# Patient Record
Sex: Female | Born: 1970 | Race: White | Hispanic: No | Marital: Married | Smoking: Current every day smoker
Health system: Southern US, Community
[De-identification: ages and names within clinical notes are randomized; demographics above are authoritative.]

## PROBLEM LIST (undated history)

## (undated) DIAGNOSIS — D72829 Elevated white blood cell count, unspecified: Secondary | ICD-10-CM

## (undated) DIAGNOSIS — F419 Anxiety disorder, unspecified: Secondary | ICD-10-CM

## (undated) DIAGNOSIS — G43909 Migraine, unspecified, not intractable, without status migrainosus: Secondary | ICD-10-CM

## (undated) HISTORY — DX: Anxiety disorder, unspecified: F41.9

---

## 2001-07-17 ENCOUNTER — Ambulatory Visit (HOSPITAL_COMMUNITY): Admission: RE | Admit: 2001-07-17 | Discharge: 2001-07-17 | Payer: Self-pay | Admitting: Obstetrics & Gynecology

## 2011-06-08 ENCOUNTER — Emergency Department (HOSPITAL_COMMUNITY)
Admission: EM | Admit: 2011-06-08 | Discharge: 2011-06-08 | Disposition: A | Discharge status: Home / Self Care | Payer: Private Health Insurance - Indemnity | Attending: Emergency Medicine | Admitting: Emergency Medicine

## 2011-06-08 ENCOUNTER — Encounter: Payer: Self-pay | Admitting: *Deleted

## 2011-06-08 DIAGNOSIS — J029 Acute pharyngitis, unspecified: Secondary | ICD-10-CM | POA: Insufficient documentation

## 2011-06-08 DIAGNOSIS — R490 Dysphonia: Secondary | ICD-10-CM | POA: Insufficient documentation

## 2011-06-08 DIAGNOSIS — J309 Allergic rhinitis, unspecified: Secondary | ICD-10-CM

## 2011-06-08 DIAGNOSIS — J329 Chronic sinusitis, unspecified: Secondary | ICD-10-CM | POA: Insufficient documentation

## 2011-06-08 MED ORDER — HYDROCODONE-ACETAMINOPHEN 5-325 MG PO TABS: 1.0000 | ORAL_TABLET | ORAL | Status: AC | PRN

## 2011-06-08 MED ORDER — AMOXICILLIN 500 MG PO CAPS: 500.0000 mg | ORAL_CAPSULE | Freq: Three times a day (TID) | ORAL | Status: AC

## 2011-06-08 MED ORDER — FEXOFENADINE-PSEUDOEPHED ER 60-120 MG PO TB12: 1.0000 | ORAL_TABLET | Freq: Two times a day (BID) | ORAL | Status: AC

## 2011-06-08 MED ORDER — LIDOCAINE VISCOUS 2 % MT SOLN
20.0000 mL | Freq: Once | OROMUCOSAL | Status: DC
  Filled 2011-06-08: qty 20

## 2011-06-08 NOTE — ED Notes (Signed)
Pt c/o sore throat, right earache, nasal congestion x 3 months. Also c/o  blisters in nose and throat since Sunday.
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
## 2011-06-08 NOTE — ED Notes (Signed)
Pt refused Lidocaine viscous solution.

## 2011-06-08 NOTE — ED Notes (Signed)
Pt stable. NAD at this time. D/C instructions and Rx given. Pt verbalized understanding. Pt ambulated with steady gate to d/c lobby.

## 2011-06-09 NOTE — ED Provider Notes (Signed)
Medical screening examination/treatment/procedure(s) were performed by non-physician practitioner and as supervising physician I was immediately available for consultation/collaboration.   Annette Liotta R. Carmen Vallecillo, MD 06/09/11 1310 

## 2011-08-11 ENCOUNTER — Emergency Department (HOSPITAL_COMMUNITY)
Admission: EM | Admit: 2011-08-11 | Discharge: 2011-08-11 | Disposition: A | Discharge status: Home / Self Care | Payer: Self-pay | Attending: Emergency Medicine | Admitting: Emergency Medicine

## 2011-08-11 ENCOUNTER — Encounter (HOSPITAL_COMMUNITY): Payer: Self-pay | Admitting: Emergency Medicine

## 2011-08-11 DIAGNOSIS — G43909 Migraine, unspecified, not intractable, without status migrainosus: Secondary | ICD-10-CM | POA: Insufficient documentation

## 2011-08-11 DIAGNOSIS — R51 Headache: Secondary | ICD-10-CM

## 2011-08-11 DIAGNOSIS — F17200 Nicotine dependence, unspecified, uncomplicated: Secondary | ICD-10-CM | POA: Insufficient documentation

## 2011-08-11 HISTORY — DX: Migraine, unspecified, not intractable, without status migrainosus: G43.909

## 2011-08-11 MED ORDER — KETOROLAC TROMETHAMINE 60 MG/2ML IM SOLN
60.0000 mg | Freq: Once | INTRAMUSCULAR | Status: AC
  Administered 2011-08-11: 60 mg via INTRAMUSCULAR
  Filled 2011-08-11: qty 2

## 2011-08-11 MED ORDER — PROMETHAZINE HCL 25 MG/ML IJ SOLN
25.0000 mg | Freq: Once | INTRAMUSCULAR | Status: AC
  Administered 2011-08-11: 25 mg via INTRAMUSCULAR
  Filled 2011-08-11: qty 1

## 2011-08-11 MED ORDER — DIPHENHYDRAMINE HCL 25 MG PO CAPS
25.0000 mg | ORAL_CAPSULE | Freq: Once | ORAL | Status: AC
  Administered 2011-08-11: 25 mg via ORAL
  Filled 2011-08-11: qty 1

## 2011-08-11 NOTE — ED Notes (Signed)
Patient c/o migraine since last night. Patient reports sensitivity to light and sound with nausea and blurred vision. Denies any vomiting. Per patient hx of migraines with same symptoms.

## 2011-08-11 NOTE — ED Notes (Signed)
Reports taking something (a pill) before she came and that the headache pain is already beginning to ease off.  Medications given as ordered.  Tolerated well.

## 2011-08-11 NOTE — ED Notes (Addendum)
C/o "migraine headache"--History for same and reports this episodes to be similar to previous ones--rates headache pain a 10 on 1-10 scale.--nausea, photophobic

## 2011-08-11 NOTE — ED Provider Notes (Signed)
History     CSN: [id number] Arrival date & time: 08/11/2011 12:28 PM   First MD Initiated Contact with Patient 08/11/11 1306      Chief Complaint  Patient presents with  . Migraine    (Consider location/radiation/quality/duration/timing/severity/associated sxs/prior treatment) Patient is a 40 y.o. female presenting with migraine. The history is provided by the patient.  Migraine The current episode started yesterday. The problem occurs constantly. The problem has not changed since onset.Associated symptoms include headaches. Pertinent negatives include no chest pain, no abdominal pain and no shortness of breath.   patient has a history of migraine headaches for which she has previously been worked up. She developed one of her typical headaches yesterday. His right-sided headache but does involve the left side somewhat to. It is throbbing. She's had some nauseousness. She has some photophobia. No vomiting. No head injury. No numbness or weakness. No relief with her medicines at home. She states she just wants a shot in the button to go home. She states that she is hungry. She states that she could not be pregnant. She states [employer] works, she cannot afford.  Past Medical History  Diagnosis Date  . Migraines     History reviewed. No pertinent past surgical history.  Family History  Problem Relation Age of Onset  . Hypertension Mother   . Heart failure Mother   . Hyperlipidemia Mother   . Hypertension Father   . Heart failure Father   . Hyperlipidemia Father   . Cancer Sister     History  Substance Use Topics  . Smoking status: Current Everyday Smoker -- 1.0 packs/day for 15 years    Types: Cigarettes  . Smokeless tobacco: Never Used  . Alcohol Use: No    OB History    Grav Para Term Preterm Abortions TAB SAB Ect Mult Living   2 2        2       Review of Systems  Constitutional: Negative for activity change and appetite change.  HENT: Negative for neck stiffness.     Eyes: Positive for photophobia. Negative for pain and visual disturbance.  Respiratory: Negative for chest tightness and shortness of breath.   Cardiovascular: Negative for chest pain and leg swelling.  Gastrointestinal: Positive for nausea. Negative for abdominal pain and diarrhea.  Genitourinary: Negative for flank pain.  Musculoskeletal: Negative for back pain.  Skin: Negative for rash.  Neurological: Positive for headaches. Negative for weakness and numbness.  Psychiatric/Behavioral: Negative for behavioral problems.    Allergies  Clindamycin/lincomycin  Home Medications   Current Outpatient Rx  Name Route Sig Dispense Refill  . FEXOFENADINE-PSEUDOEPHEDRINE 60-120 MG PO TB12 Oral Take 1 tablet by mouth every 12 (twelve) hours. 30 tablet 0    BP 140/86  Pulse 108  Temp(Src) 97.9 F (36.6 C) (Oral)  Resp 18  Ht 5\' 7"  (1.702 m)  Wt 135 lb (61.236 kg)  BMI 21.14 kg/m2  SpO2 100%  LMP 08/08/2011  Physical Exam  Nursing note and vitals reviewed. Constitutional: She is oriented to person, place, and time. She appears well-developed and well-nourished.       Uncomfortable appearing  HENT:  Head: Normocephalic and atraumatic.  Eyes: Pupils are equal, round, and reactive to light.  Neck: Normal range of motion.  Cardiovascular: Normal rate, regular rhythm and normal heart sounds.   No murmur heard. Pulmonary/Chest: Effort normal and breath sounds normal. No respiratory distress. She has no wheezes. She has no rales.  Abdominal: Soft. Bowel sounds  are normal. She exhibits no distension. There is no tenderness. There is no rebound and no guarding.  Musculoskeletal: Normal range of motion.  Neurological: She is alert and oriented to person, place, and time. No cranial nerve deficit.  Skin: Skin is warm and dry.  Psychiatric: She has a normal mood and affect. Her speech is normal.       Patient is tearful    ED Course  Procedures (including critical care time)  Labs  Reviewed - No data to display No results found.   1. Headache       MDM  Patient has a headache typical of her migraines. She has previously been worked up. No new symptoms or injury. She states she just wants a shot and to go home she will be given Phenergan Toradol and Benadryl to be discharged.        Juliet Rude. Rubin Payor, MD 08/11/11 1323

## 2011-10-10 ENCOUNTER — Encounter (HOSPITAL_COMMUNITY): Payer: Self-pay | Admitting: Emergency Medicine

## 2011-10-10 ENCOUNTER — Emergency Department (HOSPITAL_COMMUNITY)
Admission: EM | Admit: 2011-10-10 | Discharge: 2011-10-10 | Disposition: A | Discharge status: Home / Self Care | Payer: Self-pay | Attending: Emergency Medicine | Admitting: Emergency Medicine

## 2011-10-10 DIAGNOSIS — R059 Cough, unspecified: Secondary | ICD-10-CM | POA: Insufficient documentation

## 2011-10-10 DIAGNOSIS — R0602 Shortness of breath: Secondary | ICD-10-CM | POA: Insufficient documentation

## 2011-10-10 DIAGNOSIS — J069 Acute upper respiratory infection, unspecified: Secondary | ICD-10-CM | POA: Insufficient documentation

## 2011-10-10 DIAGNOSIS — J40 Bronchitis, not specified as acute or chronic: Secondary | ICD-10-CM | POA: Insufficient documentation

## 2011-10-10 DIAGNOSIS — R05 Cough: Secondary | ICD-10-CM | POA: Insufficient documentation

## 2011-10-10 DIAGNOSIS — R509 Fever, unspecified: Secondary | ICD-10-CM | POA: Insufficient documentation

## 2011-10-10 MED ORDER — PREDNISONE 10 MG PO TABS: ORAL_TABLET | ORAL | Status: DC

## 2011-10-10 MED ORDER — DOXYCYCLINE HYCLATE 100 MG PO TABS
100.0000 mg | ORAL_TABLET | Freq: Once | ORAL | Status: DC
  Filled 2011-10-10: qty 1

## 2011-10-10 MED ORDER — ALBUTEROL SULFATE HFA 108 (90 BASE) MCG/ACT IN AERS: 2.0000 | INHALATION_SPRAY | RESPIRATORY_TRACT | Status: DC

## 2011-10-10 MED ORDER — DOXYCYCLINE HYCLATE 100 MG PO CAPS: 100.0000 mg | ORAL_CAPSULE | Freq: Two times a day (BID) | ORAL | Status: AC

## 2011-10-10 NOTE — ED Notes (Signed)
Patient c/o fevers, cough, and shortness of breath since Sunday. Per patient recently took Augmentin for sinus infection. Per patient cough productive, sputum dark brown-not observed.

## 2011-10-10 NOTE — ED Provider Notes (Signed)
History     CSN: [id number]  Arrival date & time 10/10/11  1114   First MD Initiated Contact with Patient 10/10/11 1155      Chief Complaint  Patient presents with  . Fever  . Cough  . Shortness of Breath    (Consider location/radiation/quality/duration/timing/severity/associated sxs/prior treatment) Patient is a 40 y.o. female presenting with fever, cough, and shortness of breath. The history is provided by the patient.  Fever Primary symptoms of the febrile illness include fever, cough and shortness of breath. Primary symptoms do not include wheezing, abdominal pain, dysuria or arthralgias. The current episode started 3 to 5 days ago. This is a new problem. The problem has been gradually worsening.  Associated with: nothing. Risk factors: none.Primary symptoms comment: headache  Cough Associated symptoms include shortness of breath. Pertinent negatives include no chest pain and no wheezing.  Shortness of Breath  Associated symptoms include a fever, cough and shortness of breath. Pertinent negatives include no chest pain and no wheezing.    Past Medical History  Diagnosis Date  . Migraines     History reviewed. No pertinent past surgical history.  Family History  Problem Relation Age of Onset  . Hypertension Mother   . Heart failure Mother   . Hyperlipidemia Mother   . Hypertension Father   . Heart failure Father   . Hyperlipidemia Father   . Cancer Sister     History  Substance Use Topics  . Smoking status: Current Everyday Smoker -- 1.0 packs/day for 15 years    Types: Cigarettes  . Smokeless tobacco: Never Used  . Alcohol Use: No    OB History    Grav Para Term Preterm Abortions TAB SAB Ect Mult Living   2 2 2       2       Review of Systems  Constitutional: Positive for fever. Negative for activity change.       All ROS Neg except as noted in HPI  HENT: Negative for nosebleeds and neck pain.   Eyes: Negative for photophobia and discharge.    Respiratory: Positive for cough and shortness of breath. Negative for wheezing.   Cardiovascular: Negative for chest pain and palpitations.  Gastrointestinal: Negative for abdominal pain and blood in stool.  Genitourinary: Negative for dysuria, frequency and hematuria.  Musculoskeletal: Negative for back pain and arthralgias.  Skin: Negative.   Neurological: Negative for dizziness, seizures and speech difficulty.  Psychiatric/Behavioral: Negative for hallucinations and confusion.    Allergies  Clindamycin/lincomycin  Home Medications   Current Outpatient Rx  Name Route Sig Dispense Refill  . FEXOFENADINE-PSEUDOEPHED ER 60-120 MG PO TB12 Oral Take 1 tablet by mouth every 12 (twelve) hours. 30 tablet 0  . OXYCODONE-ACETAMINOPHEN 7.5-325 MG PO TABS Oral Take 1 tablet by mouth every 4 (four) hours as needed. For migraines     . SUMATRIPTAN SUCCINATE 50 MG PO TABS Oral Take 50 mg by mouth every 2 (two) hours as needed. For migraines       BP 116/73  Pulse 102  Temp(Src) 99.6 F (37.6 C) (Oral)  Resp 18  Ht 5\' 7"  (1.702 m)  Wt 140 lb (63.504 kg)  BMI 21.93 kg/m2  SpO2 99%  LMP 09/27/2011  Physical Exam  Nursing note and vitals reviewed. Constitutional: She is oriented to person, place, and time. She appears well-developed and well-nourished.  Non-toxic appearance.  HENT:  Head: Normocephalic.  Right Ear: Tympanic membrane and external ear normal.  Left Ear: Tympanic membrane and  external ear normal.  Eyes: EOM and lids are normal. Pupils are equal, round, and reactive to light.  Neck: Normal range of motion. Neck supple. Carotid bruit is not present.  Cardiovascular: Regular rhythm, normal heart sounds, intact distal pulses and normal pulses.  Tachycardia present.  Exam reveals no friction rub.   Pulmonary/Chest: Effort normal. No respiratory distress. She has wheezes. She has rhonchi.  Abdominal: Soft. Bowel sounds are normal. There is no tenderness. There is no guarding.   Musculoskeletal: Normal range of motion.  Lymphadenopathy:       Head (right side): No submandibular adenopathy present.       Head (left side): No submandibular adenopathy present.    She has no cervical adenopathy.  Neurological: She is alert and oriented to person, place, and time. She has normal strength. No cranial nerve deficit or sensory deficit.  Skin: Skin is warm and dry.  Psychiatric: She has a normal mood and affect. Her speech is normal.    ED Course  Procedures (including critical care time)  Labs Reviewed - No data to display No results found.   No diagnosis found.    MDM  I have reviewed nursing notes, vital signs, and all appropriate lab and imaging results for this patient.        Kathie Dike, PA 10/10/11 1733  Kathie Dike, [place] 10/10/11 [phone]

## 2011-10-10 NOTE — ED Notes (Signed)
Pt left before receiving  Inhaler.

## 2011-10-11 NOTE — ED Provider Notes (Signed)
Medical screening examination/treatment/procedure(s) were performed by non-physician practitioner and as supervising physician I was immediately available for consultation/collaboration.   Laray Anger, DO 10/11/11 [phone]

## 2012-01-24 ENCOUNTER — Encounter (HOSPITAL_COMMUNITY): Payer: Self-pay | Admitting: Emergency Medicine

## 2012-01-24 ENCOUNTER — Emergency Department (HOSPITAL_COMMUNITY)
Admission: EM | Admit: 2012-01-24 | Discharge: 2012-01-24 | Disposition: A | Discharge status: Home / Self Care | Payer: BC Managed Care – PPO | Attending: Emergency Medicine | Admitting: Emergency Medicine

## 2012-01-24 DIAGNOSIS — R51 Headache: Secondary | ICD-10-CM | POA: Insufficient documentation

## 2012-01-24 MED ORDER — METOCLOPRAMIDE HCL 5 MG/ML IJ SOLN
20.0000 mg | Freq: Once | INTRAVENOUS | Status: AC
  Administered 2012-01-24: 20 mg via INTRAVENOUS
  Filled 2012-01-24 (×2): qty 4

## 2012-01-24 MED ORDER — SUMATRIPTAN SUCCINATE 6 MG/0.5ML ~~LOC~~ SOLN
6.0000 mg | Freq: Once | SUBCUTANEOUS | Status: AC
  Administered 2012-01-24: 6 mg via SUBCUTANEOUS
  Filled 2012-01-24: qty 0.5

## 2012-01-24 MED ORDER — DIPHENHYDRAMINE HCL 50 MG/ML IJ SOLN
25.0000 mg | Freq: Once | INTRAMUSCULAR | Status: AC
  Administered 2012-01-24: 25 mg via INTRAVENOUS
  Filled 2012-01-24: qty 1

## 2012-01-24 MED ORDER — SODIUM CHLORIDE 0.9 % IV BOLUS (SEPSIS)
1000.0000 mL | Freq: Once | INTRAVENOUS | Status: AC
  Administered 2012-01-24: 1000 mL via INTRAVENOUS

## 2012-01-24 MED ORDER — KETOROLAC TROMETHAMINE 30 MG/ML IJ SOLN
30.0000 mg | Freq: Once | INTRAMUSCULAR | Status: AC
  Administered 2012-01-24: 30 mg via INTRAVENOUS
  Filled 2012-01-24: qty 1

## 2012-01-24 NOTE — Discharge Instructions (Signed)
Follow up with your md if problems °

## 2012-01-24 NOTE — ED Notes (Signed)
Patient c/o right sided headache since 0200; states she feels sick.

## 2012-01-24 NOTE — ED Notes (Signed)
Discharge instructions reviewed with pt, questions answered. Pt verbalized understanding.  

## 2012-01-24 NOTE — ED Notes (Signed)
Pt reports no relief from headache, "these medicines are not working, please tell the doctor". Will make MD aware.

## 2012-01-24 NOTE — ED Provider Notes (Signed)
History     CSN: [id number]  Arrival date & time 01/24/12  0619   First MD Initiated Contact with Patient 01/24/12 [phone]      Chief Complaint  Patient presents with  . Headache    (Consider location/radiation/quality/duration/timing/severity/associated sxs/prior treatment) Patient is a 41 y.o. female presenting with headaches. The history is provided by the patient (pt states she started with a headache today). No language interpreter was used.  Headache  This is a recurrent problem. The current episode started 1 to 2 hours ago. The problem occurs constantly. The problem has not changed since onset.The headache is associated with nothing. The pain is located in the left unilateral region. The quality of the pain is described as sharp. The pain is at a severity of 5/10. The pain is moderate. Pertinent negatives include no anorexia and no fever. She has tried nothing for the symptoms. The treatment provided no relief.    Past Medical History  Diagnosis Date  . Migraines     History reviewed. No pertinent past surgical history.  Family History  Problem Relation Age of Onset  . Hypertension Mother   . Heart failure Mother   . Hyperlipidemia Mother   . Hypertension Father   . Heart failure Father   . Hyperlipidemia Father   . Cancer Sister     History  Substance Use Topics  . Smoking status: Current Everyday Smoker -- 1.0 packs/day for 15 years    Types: Cigarettes  . Smokeless tobacco: Never Used  . Alcohol Use: No    OB History    Grav Para Term Preterm Abortions TAB SAB Ect Mult Living   2 2 2       2       Review of Systems  Constitutional: Negative for fever and fatigue.  HENT: Negative for congestion, sinus pressure and ear discharge.   Eyes: Negative for discharge.  Respiratory: Negative for cough.   Cardiovascular: Negative for chest pain.  Gastrointestinal: Negative for abdominal pain, diarrhea and anorexia.  Genitourinary: Negative for frequency and  hematuria.  Musculoskeletal: Negative for back pain.  Skin: Negative for rash.  Neurological: Positive for headaches. Negative for seizures.  Hematological: Negative.   Psychiatric/Behavioral: Negative for hallucinations.    Allergies  Clindamycin/lincomycin  Home Medications   Current Outpatient Rx  Name Route Sig Dispense Refill  . FEXOFENADINE-PSEUDOEPHED ER 60-120 MG PO TB12 Oral Take 1 tablet by mouth every 12 (twelve) hours. 30 tablet 0  . OXYCODONE-ACETAMINOPHEN 7.5-325 MG PO TABS Oral Take 1 tablet by mouth every 4 (four) hours as needed. For migraines     . SUMATRIPTAN SUCCINATE 50 MG PO TABS Oral Take 50 mg by mouth every 2 (two) hours as needed. For migraines     . PREDNISONE 10 MG PO TABS  6,5,4,3,2,1 - take with food 21 tablet 0    BP 146/111  Pulse 88  Temp(Src) 97.8 F (36.6 C) (Oral)  Resp 20  Ht 5\' 7"  (1.702 m)  Wt 160 lb (72.576 kg)  BMI 25.06 kg/m2  SpO2 98%  LMP 01/20/2012  Physical Exam  Constitutional: She is oriented to person, place, and time. She appears well-developed.  HENT:  Head: Normocephalic and atraumatic.  Eyes: Conjunctivae and EOM are normal. No scleral icterus.  Neck: Neck supple. No thyromegaly present.  Cardiovascular: Normal rate and regular rhythm.  Exam reveals no gallop and no friction rub.   No murmur heard. Pulmonary/Chest: No stridor. She has no wheezes. She has no rales.  She exhibits no tenderness.  Abdominal: She exhibits no distension. There is no tenderness. There is no rebound.  Musculoskeletal: Normal range of motion. She exhibits no edema.  Lymphadenopathy:    She has no cervical adenopathy.  Neurological: She is oriented to person, place, and time. Coordination normal.  Skin: No rash noted. No erythema.  Psychiatric: She has a normal mood and affect. Her behavior is normal.    ED Course  Procedures (including critical care time)   Labs Reviewed  URINALYSIS, ROUTINE W REFLEX MICROSCOPIC   No results  found.   1. Headache    Pt improved with imitrex   MDM  migraine        Benny Lennert, MD 01/24/12 [phone]

## 2012-08-21 ENCOUNTER — Other Ambulatory Visit (HOSPITAL_COMMUNITY): Payer: Self-pay | Admitting: Internal Medicine

## 2012-08-21 DIAGNOSIS — Z139 Encounter for screening, unspecified: Secondary | ICD-10-CM

## 2012-08-21 DIAGNOSIS — Z0000 Encounter for general adult medical examination without abnormal findings: Secondary | ICD-10-CM

## 2012-08-24 ENCOUNTER — Inpatient Hospital Stay (HOSPITAL_COMMUNITY): Admission: RE | Admit: 2012-08-24 | Payer: BC Managed Care – PPO | Source: Ambulatory Visit

## 2013-02-11 ENCOUNTER — Encounter (HOSPITAL_COMMUNITY): Payer: Self-pay | Admitting: *Deleted

## 2013-02-11 ENCOUNTER — Emergency Department (HOSPITAL_COMMUNITY): Payer: BC Managed Care – PPO

## 2013-02-11 ENCOUNTER — Emergency Department (HOSPITAL_COMMUNITY)
Admission: EM | Admit: 2013-02-11 | Discharge: 2013-02-11 | Disposition: A | Discharge status: Home / Self Care | Payer: BC Managed Care – PPO | Attending: Emergency Medicine | Admitting: Emergency Medicine

## 2013-02-11 DIAGNOSIS — D72829 Elevated white blood cell count, unspecified: Secondary | ICD-10-CM | POA: Insufficient documentation

## 2013-02-11 DIAGNOSIS — R1110 Vomiting, unspecified: Secondary | ICD-10-CM | POA: Insufficient documentation

## 2013-02-11 DIAGNOSIS — F17200 Nicotine dependence, unspecified, uncomplicated: Secondary | ICD-10-CM | POA: Insufficient documentation

## 2013-02-11 DIAGNOSIS — Z3202 Encounter for pregnancy test, result negative: Secondary | ICD-10-CM | POA: Insufficient documentation

## 2013-02-11 DIAGNOSIS — N201 Calculus of ureter: Secondary | ICD-10-CM | POA: Insufficient documentation

## 2013-02-11 DIAGNOSIS — Z79899 Other long term (current) drug therapy: Secondary | ICD-10-CM | POA: Insufficient documentation

## 2013-02-11 DIAGNOSIS — G43909 Migraine, unspecified, not intractable, without status migrainosus: Secondary | ICD-10-CM | POA: Insufficient documentation

## 2013-02-11 LAB — CBC WITH DIFFERENTIAL/PLATELET
Basophils Absolute: 0 10*3/uL (ref 0.0–0.1)
Basophils Relative: 0 % (ref 0–1)
Eosinophils Absolute: 0.2 10*3/uL (ref 0.0–0.7)
HCT: 41.8 % (ref 36.0–46.0)
Hemoglobin: 14.1 g/dL (ref 12.0–15.0)
Lymphocytes Relative: 15 % (ref 12–46)
MCHC: 33.7 g/dL (ref 30.0–36.0)
Monocytes Relative: 6 % (ref 3–12)
Neutro Abs: 16.1 10*3/uL — ABNORMAL HIGH (ref 1.7–7.7)
Neutrophils Relative %: 78 % — ABNORMAL HIGH (ref 43–77)
RDW: 13.7 % (ref 11.5–15.5)
WBC: 20.6 10*3/uL — ABNORMAL HIGH (ref 4.0–10.5)

## 2013-02-11 LAB — BASIC METABOLIC PANEL
CO2: 30 mEq/L (ref 19–32)
Chloride: 98 mEq/L (ref 96–112)
GFR calc Af Amer: >90 mL/min (ref >90)
Potassium: 4.5 mEq/L (ref 3.5–5.1)

## 2013-02-11 LAB — URINALYSIS, ROUTINE W REFLEX MICROSCOPIC
Glucose, UA: NEGATIVE mg/dL
Leukocytes, UA: NEGATIVE
Protein, ur: NEGATIVE mg/dL
Specific Gravity, Urine: 1.02 (ref 1.005–1.030)

## 2013-02-11 LAB — URINE MICROSCOPIC-ADD ON

## 2013-02-11 LAB — PREGNANCY, URINE: Preg Test, Ur: NEGATIVE

## 2013-02-11 MED ORDER — KETOROLAC TROMETHAMINE 30 MG/ML IJ SOLN
30.0000 mg | Freq: Once | INTRAMUSCULAR | Status: AC
  Administered 2013-02-11: 30 mg via INTRAVENOUS
  Filled 2013-02-11: qty 1

## 2013-02-11 MED ORDER — SODIUM CHLORIDE 0.9 % IV BOLUS (SEPSIS)
1000.0000 mL | Freq: Once | INTRAVENOUS | Status: AC
  Administered 2013-02-11: 1000 mL via INTRAVENOUS

## 2013-02-11 MED ORDER — ONDANSETRON 8 MG PO TBDP: 8.0000 mg | ORAL_TABLET | Freq: Three times a day (TID) | ORAL | Status: DC | PRN

## 2013-02-11 MED ORDER — OXYCODONE-ACETAMINOPHEN 5-325 MG PO TABS: 1.0000 | ORAL_TABLET | Freq: Four times a day (QID) | ORAL | Status: DC | PRN

## 2013-02-11 MED ORDER — FENTANYL CITRATE 0.05 MG/ML IJ SOLN
50.0000 ug | Freq: Once | INTRAMUSCULAR | Status: AC
  Administered 2013-02-11: 50 ug via INTRAVENOUS
  Filled 2013-02-11: qty 2

## 2013-02-11 MED ORDER — HYDROMORPHONE HCL PF 1 MG/ML IJ SOLN
1.0000 mg | Freq: Once | INTRAMUSCULAR | Status: AC
  Administered 2013-02-11: 1 mg via INTRAVENOUS
  Filled 2013-02-11: qty 1

## 2013-02-11 MED ORDER — ACETAMINOPHEN 325 MG PO TABS
650.0000 mg | ORAL_TABLET | Freq: Once | ORAL | Status: AC
  Administered 2013-02-11: 650 mg via ORAL
  Filled 2013-02-11: qty 2

## 2013-02-11 MED ORDER — ONDANSETRON HCL 4 MG/2ML IJ SOLN
4.0000 mg | Freq: Once | INTRAMUSCULAR | Status: AC
  Administered 2013-02-11: 4 mg via INTRAVENOUS
  Filled 2013-02-11: qty 2

## 2013-02-11 NOTE — ED Notes (Signed)
Rt flank pain x 1 hour, moaning  And crying out with pain

## 2013-02-11 NOTE — ED Notes (Signed)
Patient c/o headache. Dr Rubin Payor made aware.

## 2013-02-11 NOTE — ED Provider Notes (Signed)
History     CSN: [id number]  Arrival date & time 02/11/13  1322   First MD Initiated Contact with Patient 02/11/13 1348    Level V caveat due 2 pain  Chief Complaint  Patient presents with  . Abdominal Pain    (Consider location/radiation/quality/duration/timing/severity/associated sxs/prior treatment) Patient is a 42 y.o. female presenting with abdominal pain.  Abdominal Pain  Patient presents with acute onset right-sided abdominal/flank pain. It is severe. She's been vomiting. Patient states the room feels hot. Patient is moaning loudly unable to give me a good history. No diarrhea. No dysuria. Past Medical History  Diagnosis Date  . Migraines     History reviewed. No pertinent past surgical history.  Family History  Problem Relation Age of Onset  . Hypertension Mother   . Heart failure Mother   . Hyperlipidemia Mother   . Hypertension Father   . Heart failure Father   . Hyperlipidemia Father   . Cancer Sister     History  Substance Use Topics  . Smoking status: Current Every Day Smoker -- 1.00 packs/day for 15 years    Types: Cigarettes  . Smokeless tobacco: Never Used  . Alcohol Use: No    OB History   Grav Para Term Preterm Abortions TAB SAB Ect Mult Living   2 2 2       2       Review of Systems  Unable to perform ROS: Acuity of condition  Gastrointestinal: Positive for abdominal pain.    Allergies  Clindamycin/lincomycin  Home Medications   Current Outpatient Rx  Name  Route  Sig  Dispense  Refill  . propranolol (INDERAL) 80 MG tablet   Oral   Take 80 mg by mouth 2 (two) times daily.         . ondansetron (ZOFRAN-ODT) 8 MG disintegrating tablet   Oral   Take 1 tablet (8 mg total) by mouth every 8 (eight) hours as needed for nausea.   10 tablet   0   . oxyCODONE-acetaminophen (PERCOCET/ROXICET) 5-325 MG per tablet   Oral   Take 1-2 tablets by mouth every 6 (six) hours as needed for pain.   20 tablet   0     BP 125/61  Pulse 68   Temp(Src) 97 F (36.1 C) (Oral)  Resp 16  Ht 5\' 7"  (1.702 m)  Wt 150 lb (68.04 kg)  BMI 23.49 kg/m2  SpO2 100%  LMP 02/08/2013  Physical Exam  Vitals reviewed. Constitutional: She appears well-developed and well-nourished.  Patient is going quickly in bed and moaning loudly  HENT:  Head: Normocephalic.  Eyes: Pupils are equal, round, and reactive to light.  Neck: Normal range of motion.  Cardiovascular: Normal rate and regular rhythm.   Pulmonary/Chest: Effort normal and breath sounds normal.  Abdominal: Soft. There is tenderness.  Right upper quadrant tenderness without rebound or guarding. No right lower quadrant tenderness.  Genitourinary:  No CVA tenderness on right.  Musculoskeletal: Normal range of motion.  Neurological: She is alert.  Skin: Skin is warm.    ED Course  Procedures (including critical care time)  Labs Reviewed  URINALYSIS, ROUTINE W REFLEX MICROSCOPIC - Abnormal; Notable for the following:    Hgb urine dipstick SMALL (*)    All other components within normal limits  CBC WITH DIFFERENTIAL - Abnormal; Notable for the following:    WBC 20.6 (*)    Platelets 473 (*)    Neutrophils Relative 78 (*)    Neutro Abs  16.1 (*)    Monocytes Absolute 1.2 (*)    All other components within normal limits  BASIC METABOLIC PANEL - Abnormal; Notable for the following:    Glucose, Bld 135 (*)    All other components within normal limits  URINE MICROSCOPIC-ADD ON - Abnormal; Notable for the following:    Squamous Epithelial / LPF MANY (*)    Bacteria, UA FEW (*)    All other components within normal limits  URINE CULTURE  PREGNANCY, URINE   Ct Abdomen Pelvis Wo Contrast  02/11/2013  *RADIOLOGY REPORT*  Clinical Data: Abdominal pain.  Right flank pain for 1 hour.  CT ABDOMEN AND PELVIS WITHOUT CONTRAST  Technique:  Multidetector CT imaging of the abdomen and pelvis was performed following the standard protocol without intravenous contrast.  Comparison: None.   Findings: Lung Bases: Normal.  Liver:  Unenhanced CT was performed per clinician order.  Lack of IV contrast limits sensitivity and specificity, especially for evaluation of abdominal/pelvic solid viscera.  Normal.  Spleen:  Normal to  Gallbladder:  High-density material layers dependently within the gallbladder which is similar attenuation to hepatic parenchyma.  On sagittal imaging, this appears to be within the gallbladder and likely represents biliary sludge or noncalcified stones.  Common bile duct:  Normal.  Pancreas:  Normal.  Adrenal glands:  Normal.  Kidneys:  The left kidney appears normal.  No left renal calculi. No hydronephrosis.  Left ureter normal.  The right kidney is mildly enlarged and shows mild hydroureteronephrosis extending to the right UVJ.  There is a 2 mm calculus at the right UVJ and intramural portion of the ureter (image number 80 series 2).  No residual collecting system calculi are identified.  Stomach:  Normal.  Small bowel:  Normal.  Colon:   Normal appendix.  No inflammatory changes colon.  Distal colon decompressed.  Pelvic Genitourinary:  Aside from the calculus, urinary bladder appears normal.  Uterus and adnexa have a physiologic appearance.  Bones:  Normal.  Vasculature: Normal.  IMPRESSION:  1.  Mild right hydroureter nephrosis with 2 mm distal right UVJ stone.  No residual collecting system calculi. 2.  Probable cholelithiasis.   Original Report Authenticated By: Andreas Newport, M.D.      1. Ureteral stone   2. Leukocytosis       MDM  Patient with right flank pain. No fever. She does have a leukocytosis, however urine does not show infection. There are no white cells but does have a few bacteria. She states that the pain is much better. She was found to have a 2 mm distal right UVJ stone. Urine cultures been sent but will not be treated with antibiotics at this time. She will follow with Dr. Jerre Simon and will return for fevers.        Juliet Rude. Rubin Payor,  MD 02/11/13 2132

## 2013-02-13 LAB — URINE CULTURE

## 2013-02-23 ENCOUNTER — Institutional Professional Consult (permissible substitution): Payer: BC Managed Care – PPO | Admitting: Nurse Practitioner

## 2013-02-23 DIAGNOSIS — R51 Headache: Secondary | ICD-10-CM

## 2013-05-04 ENCOUNTER — Ambulatory Visit: Payer: BC Managed Care – PPO | Admitting: Neurology

## 2013-11-05 ENCOUNTER — Telehealth: Payer: Self-pay | Admitting: *Deleted

## 2013-11-05 ENCOUNTER — Encounter: Payer: Self-pay | Admitting: Sports Medicine

## 2013-11-05 ENCOUNTER — Ambulatory Visit (INDEPENDENT_AMBULATORY_CARE_PROVIDER_SITE_OTHER): Payer: BC Managed Care – PPO | Admitting: Sports Medicine

## 2013-11-05 VITALS — BP 130/86 | HR 113 | Ht 67.0 in | Wt 172.0 lb

## 2013-11-05 DIAGNOSIS — G894 Chronic pain syndrome: Secondary | ICD-10-CM | POA: Insufficient documentation

## 2013-11-05 DIAGNOSIS — IMO0001 Reserved for inherently not codable concepts without codable children: Secondary | ICD-10-CM

## 2013-11-05 DIAGNOSIS — Z299 Encounter for prophylactic measures, unspecified: Secondary | ICD-10-CM

## 2013-11-05 DIAGNOSIS — M797 Fibromyalgia: Secondary | ICD-10-CM | POA: Insufficient documentation

## 2013-11-05 DIAGNOSIS — E669 Obesity, unspecified: Secondary | ICD-10-CM

## 2013-11-05 DIAGNOSIS — G43909 Migraine, unspecified, not intractable, without status migrainosus: Secondary | ICD-10-CM

## 2013-11-05 MED ORDER — PHENTERMINE HCL 37.5 MG PO CAPS: 37.5000 mg | ORAL_CAPSULE | ORAL | Status: DC

## 2013-11-05 MED ORDER — DULOXETINE HCL 30 MG PO CPEP: 30.0000 mg | ORAL_CAPSULE | Freq: Every day | ORAL | Status: DC

## 2013-11-05 MED ORDER — TRAMADOL HCL 50 MG PO TABS: ORAL_TABLET | ORAL | Status: DC

## 2013-11-05 MED ORDER — PROPRANOLOL HCL ER 160 MG PO CP24: 160.0000 mg | ORAL_CAPSULE | Freq: Two times a day (BID) | ORAL | Status: DC

## 2013-11-05 MED ORDER — TOPIRAMATE 50 MG PO TABS: ORAL_TABLET | ORAL | Status: DC

## 2013-11-05 NOTE — Assessment & Plan Note (Signed)
Starting Cymbalta 

## 2013-11-05 NOTE — Assessment & Plan Note (Signed)
Patient has not had a good response to Topamax. She does well with propranolol. I am going to double this. Return in one month regarding this.

## 2013-11-05 NOTE — Assessment & Plan Note (Signed)
Phentermine, Topamax, nutritionist.

## 2013-11-05 NOTE — Progress Notes (Signed)
  Subjective:    CC: Establish care.   HPI:  Chronic pain: Desires a referral to pain management, she is currently taking Percocet and asks me if I can refill this today. She has headaches, back pain, and knee and leg pain.  Fibromyalgia: She does have significant depressed mood, and multiple tender areas on her neck, back, and hips, and shoulders. She has never been on a fibromyalgia specific medication. Denies suicidal or homicidal ideation.  Migraines: Has seen 2 neurologists, did not have a good response to Topamax, has a moderate response to triptans, has had the best response to propranolol at 80 mg twice a day.  Smoker: Not yet ready to quit.  Obesity: Desires to start weight loss medication.  Preventative measures: Needs referral to OB/GYN for cervical cancer screening, asks for refill on Adderall, this was denied.  Past medical history, Surgical history, Family history not pertinant except as noted below, Social history, Allergies, and medications have been entered into the medical record, reviewed, and no changes needed.   Review of Systems: No headache, visual changes, nausea, vomiting, diarrhea, constipation, dizziness, abdominal pain, skin rash, fevers, chills, night sweats, swollen lymph nodes, weight loss, chest pain, body aches, joint swelling, muscle aches, shortness of breath, mood changes, visual or auditory hallucinations.  Objective:    General: Well Developed, well nourished, and in no acute distress.  Neuro: Alert and oriented x3, extra-ocular muscles intact, sensation grossly intact.  HEENT: Normocephalic, atraumatic, pupils equal round reactive to light, neck supple, no masses, no lymphadenopathy, thyroid nonpalpable.  Skin: Warm and dry, no rashes noted.  Cardiac: Regular rate and rhythm, no murmurs rubs or gallops.  Respiratory: Clear to auscultation bilaterally. Not using accessory muscles, speaking in full sentences.  Abdominal: Soft, nontender, nondistended,  positive bowel sounds, no masses, no organomegaly.  Musculoskeletal: Shoulder, elbow, wrist, hip, knee, ankle stable, and with full range of motion.  Impression and Recommendations:    The patient was counselled, risk factors were discussed, anticipatory guidance given.

## 2013-11-05 NOTE — Assessment & Plan Note (Signed)
Currently on Percocet. She does need a referral to a pain management center. Pain is predominantly in the back, headaches, and knee and lower leg pain. I will help her with non-narcotic measures.

## 2013-11-05 NOTE — Telephone Encounter (Signed)
Prescription is in my box 

## 2013-11-05 NOTE — Telephone Encounter (Signed)
Pt calls and wanted to ask you if you would give her Tramadol for the pain until she sees pain management. Barry DienesKimberly Gordon, LPN

## 2013-11-05 NOTE — Assessment & Plan Note (Signed)
Referral to OB/GYN for cervical cancer screening. Checking routine blood work. Tdap was 3 years ago.

## 2013-11-08 ENCOUNTER — Other Ambulatory Visit: Payer: Self-pay

## 2013-12-09 ENCOUNTER — Other Ambulatory Visit: Payer: Self-pay | Admitting: Sports Medicine

## 2014-08-22 ENCOUNTER — Encounter: Payer: Self-pay | Admitting: Sports Medicine

## 2015-03-16 ENCOUNTER — Encounter (HOSPITAL_COMMUNITY): Payer: Self-pay

## 2015-03-16 ENCOUNTER — Emergency Department (HOSPITAL_COMMUNITY)
Admission: EM | Admit: 2015-03-16 | Discharge: 2015-03-16 | Disposition: A | Discharge status: Home / Self Care | Payer: BLUE CROSS/BLUE SHIELD | Attending: Emergency Medicine | Admitting: Emergency Medicine

## 2015-03-16 DIAGNOSIS — Z79899 Other long term (current) drug therapy: Secondary | ICD-10-CM | POA: Diagnosis not present

## 2015-03-16 DIAGNOSIS — Y9389 Activity, other specified: Secondary | ICD-10-CM | POA: Diagnosis not present

## 2015-03-16 DIAGNOSIS — Y92410 Unspecified street and highway as the place of occurrence of the external cause: Secondary | ICD-10-CM | POA: Insufficient documentation

## 2015-03-16 DIAGNOSIS — Z720 Tobacco use: Secondary | ICD-10-CM | POA: Diagnosis not present

## 2015-03-16 DIAGNOSIS — T1490 Injury, unspecified: Secondary | ICD-10-CM | POA: Insufficient documentation

## 2015-03-16 DIAGNOSIS — Y998 Other external cause status: Secondary | ICD-10-CM | POA: Insufficient documentation

## 2015-03-16 DIAGNOSIS — T148XXA Other injury of unspecified body region, initial encounter: Secondary | ICD-10-CM

## 2015-03-16 HISTORY — DX: Elevated white blood cell count, unspecified: D72.829

## 2015-03-16 MED ORDER — HYDROCODONE-ACETAMINOPHEN 5-325 MG PO TABS: 1.0000 | ORAL_TABLET | ORAL | Status: DC | PRN

## 2015-03-16 MED ORDER — IBUPROFEN 600 MG PO TABS: 600.0000 mg | ORAL_TABLET | Freq: Four times a day (QID) | ORAL | Status: DC | PRN
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
## 2015-09-26 ENCOUNTER — Emergency Department (HOSPITAL_COMMUNITY)
Admission: EM | Admit: 2015-09-26 | Discharge: 2015-09-26 | Disposition: A | Discharge status: Home / Self Care | Payer: BLUE CROSS/BLUE SHIELD | Attending: Emergency Medicine | Admitting: Emergency Medicine

## 2015-09-26 ENCOUNTER — Encounter (HOSPITAL_COMMUNITY): Payer: Self-pay | Admitting: *Deleted

## 2015-09-26 DIAGNOSIS — Z79899 Other long term (current) drug therapy: Secondary | ICD-10-CM | POA: Insufficient documentation

## 2015-09-26 DIAGNOSIS — R599 Enlarged lymph nodes, unspecified: Secondary | ICD-10-CM | POA: Insufficient documentation

## 2015-09-26 DIAGNOSIS — Z862 Personal history of diseases of the blood and blood-forming organs and certain disorders involving the immune mechanism: Secondary | ICD-10-CM | POA: Diagnosis not present

## 2015-09-26 DIAGNOSIS — K029 Dental caries, unspecified: Secondary | ICD-10-CM | POA: Insufficient documentation

## 2015-09-26 DIAGNOSIS — Z79891 Long term (current) use of opiate analgesic: Secondary | ICD-10-CM | POA: Diagnosis not present

## 2015-09-26 DIAGNOSIS — K047 Periapical abscess without sinus: Secondary | ICD-10-CM | POA: Diagnosis not present

## 2015-09-26 DIAGNOSIS — G43909 Migraine, unspecified, not intractable, without status migrainosus: Secondary | ICD-10-CM | POA: Diagnosis not present

## 2015-09-26 DIAGNOSIS — K0889 Other specified disorders of teeth and supporting structures: Secondary | ICD-10-CM | POA: Diagnosis present

## 2015-09-26 MED ORDER — OXYCODONE-ACETAMINOPHEN 5-325 MG PO TABS
1.0000 | ORAL_TABLET | Freq: Once | ORAL | Status: AC
  Administered 2015-09-26: 1 via ORAL
  Filled 2015-09-26: qty 1

## 2015-09-26 MED ORDER — CLINDAMYCIN HCL 150 MG PO CAPS
300.0000 mg | ORAL_CAPSULE | Freq: Once | ORAL | Status: AC
  Administered 2015-09-26: 300 mg via ORAL
  Filled 2015-09-26: qty 2

## 2015-09-26 MED ORDER — CLINDAMYCIN HCL 150 MG PO CAPS: 300.0000 mg | ORAL_CAPSULE | Freq: Three times a day (TID) | ORAL | Status: DC

## 2015-09-26 MED ORDER — OXYCODONE-ACETAMINOPHEN 5-325 MG PO TABS: 1.0000 | ORAL_TABLET | ORAL | Status: DC | PRN

## 2015-09-26 NOTE — Discharge Instructions (Signed)
Keep your appointment tomorrow with the dentist. Do NOT take iubprofen 800 mg every 2 hours because it will cause you to have stomach problems. Only take 800 mg every 8 hours.   Dental Abscess A dental abscess is a collection of pus in or around a tooth. CAUSES This condition is caused by a bacterial infection around the root of the tooth that involves the inner part of the tooth (pulp). It may result from:  Severe tooth decay.  Trauma to the tooth that allows bacteria to enter into the pulp, such as a broken or chipped tooth.  Severe gum disease around a tooth. SYMPTOMS Symptoms of this condition include:  Severe pain in and around the infected tooth.  Swelling and redness around the infected tooth, in the mouth, or in the face.  Tenderness.  Pus drainage.  Bad breath.  Bitter taste in the mouth.  Difficulty swallowing.  Difficulty opening the mouth.  Nausea.  Vomiting.  Chills.  Swollen neck glands.  Fever. DIAGNOSIS This condition is diagnosed with examination of the infected tooth. During the exam, your dentist may tap on the infected tooth. Your dentist will also ask about your medical and dental history and may order X-rays. TREATMENT This condition is treated by eliminating the infection. This may be done with:  Antibiotic medicine.  A root canal. This may be performed to save the tooth.  Pulling (extracting) the tooth. This may also involve draining the abscess. This is done if the tooth cannot be saved. HOME CARE INSTRUCTIONS  Take medicines only as directed by your dentist.  If you were prescribed antibiotic medicine, finish all of it even if you start to feel better.  Rinse your mouth (gargle) often with salt water to relieve pain or swelling.  Do not drive or operate heavy machinery while taking pain medicine.  Do not apply heat to the outside of your mouth.  Keep all follow-up visits as directed by your dentist. This is important. SEEK  MEDICAL CARE IF:  Your pain is worse and is not helped by medicine. SEEK IMMEDIATE MEDICAL CARE IF:  You have a fever or chills.  Your symptoms suddenly get worse.  You have a very bad headache.  You have problems breathing or swallowing.  You have trouble opening your mouth.  You have swelling in your neck or around your eye.   This information is not intended to replace advice given to you by your health care provider. Make sure you discuss any questions you have with your health care provider.   Document Released: 10/07/2005 Document Revised: 02/21/2015 Document Reviewed: 10/04/2014 Elsevier Interactive Patient Education [employer]2016 Else[employer] Inc.

## 2015-09-26 NOTE — ED Notes (Signed)
Pt states she has taken ibuprofen and hydrocodone for pain.

## 2015-09-26 NOTE — ED Notes (Signed)
Pt is having right lower dental pain starting 2 days ago. Pt does have swelling noted under right jaw. Pt denies any trouble breathing or airway issues.    Pt is anxious and tearful upon triage.

## 2015-09-26 NOTE — ED Provider Notes (Signed)
CSN: [id number]     Arrival date & time 09/26/15  1639 History   First MD Initiated Contact with Patient 09/26/15 1700     Chief Complaint  Patient presents with  . Dental Pain     (Consider location/radiation/quality/duration/timing/severity/associated sxs/prior Treatment) Patient is a 44 y.o. female presenting with tooth pain. The history is provided by the patient.  Dental Pain Location:  Lower Lower teeth location:  31/RL 2nd molar Quality:  Throbbing Severity:  Severe Duration:  2 days Timing:  Constant Progression:  Worsening Chronicity:  New Context: abscess and dental caries   Relieved by:  Nothing Worsened by:  Cold food/drink and pressure Ineffective treatments:  NSAIDs Associated symptoms: facial pain and facial swelling   Associated symptoms: no trismus   Risk factors: smoking    Nancy Burnett is a 44 y.o. female who presents to the ED with dental pain that started 2 days ago. She complains of swelling of her face on the right side at her jaw. She has taken ibuprofen and hydrocodone for pain without relief. Patient has an appointment with the dentist tomorrow but could not take the pain until then. Patient states she has been taking ibuprofen 800 mg PO every 2 hours today with minimal relief and using vanilla extract on the area.   Past Medical History  Diagnosis Date  . Migraines   . Elevated WBCs    History reviewed. No pertinent past surgical history. Family History  Problem Relation Age of Onset  . Hypertension Mother   . Heart failure Mother   . Hyperlipidemia Mother   . Hypertension Father   . Heart failure Father   . Hyperlipidemia Father   . Cancer Sister    Social History  Substance Use Topics  . Smoking status: Current Every Day Smoker -- 1.00 packs/day for 15 years    Types: Cigarettes  . Smokeless tobacco: Never Used  . Alcohol Use: No   OB History    Gravida Para Term Preterm AB TAB SAB Ectopic Multiple Living   2 2 2       2       Review of Systems  HENT: Positive for dental problem and facial swelling.   Hematological: Positive for adenopathy.  all other systems negative    Allergies  Review of patient's allergies indicates no active allergies.  Home Medications   Prior to Admission medications   Medication Sig Start Date End Date Taking? Authorizing Provider  ALPRAZolam Prudy Feeler(XANAX) 1 MG tablet Take 1 mg by mouth daily.  02/22/15   Historical Provider, MD  amphetamine-dextroamphetamine (ADDERALL) 15 MG tablet Take 1 tablet by mouth 2 (two) times daily. 02/23/15   Historical Provider, MD  clindamycin (CLEOCIN) 150 MG capsule Take 2 capsules (300 mg total) by mouth 3 (three) times daily. 09/26/15   Hope Orlene OchM Neese, NP  DULoxetine (CYMBALTA) 30 MG capsule Take 1 capsule (30 mg total) by mouth daily. Patient not taking: Reported on 03/16/2015 11/05/13   Monica Bectonhomas J Thekkekandam, MD  HYDROcodone-acetaminophen (NORCO/VICODIN) 5-325 MG per tablet Take 1 tablet by mouth every 4 (four) hours as needed. 03/16/15   Burgess AmorJulie Idol, PA-C  ibuprofen (ADVIL,MOTRIN) 600 MG tablet Take 1 tablet (600 mg total) by mouth every 6 (six) hours as needed. 03/16/15   Burgess AmorJulie Idol, PA-C  Oxycodone HCl 10 MG TABS Take 10 mg by mouth daily.  02/22/15   Historical Provider, MD  oxyCODONE-acetaminophen (PERCOCET/ROXICET) 5-325 MG tablet Take 1 tablet by mouth every 4 (four) hours as needed  for severe pain. 09/26/15   Hope Orlene Och, NP  rizatriptan (MAXALT-MLT) 10 MG disintegrating tablet Take 10 mg by mouth as needed for migraine. May repeat in 2 hours if needed    Historical Provider, MD  SUMAtriptan (IMITREX) 100 MG tablet  03/04/15   Historical Provider, MD   BP 160/92 mmHg  Pulse 113  Temp(Src) 97.5 F (36.4 C) (Oral)  Resp 20  Ht  (1.702 m)  Wt 70.308 kg  BMI 24.27 kg/m2  SpO2 100%  LMP 09/19/2015 Physical Exam  Constitutional: She is oriented to person, place, and time. She appears well-developed and well-nourished.  HENT:  Mouth/Throat: Uvula is  midline and oropharynx is clear and moist.    Right lower second molar with decay. There is swelling and erythema of the gum surrounding the tooth. Mild facial swelling noted.   Eyes: Conjunctivae and EOM are normal.  Neck: Normal range of motion. Neck supple.  Cardiovascular: Normal rate and regular rhythm.   Pulmonary/Chest: Effort normal and breath sounds normal.  Abdominal: Soft. There is no tenderness.  Musculoskeletal: Normal range of motion.  Lymphadenopathy:    She has cervical adenopathy.  Neurological: She is alert and oriented to person, place, and time. No cranial nerve deficit.  Skin: Skin is warm and dry.  Psychiatric: She has a normal mood and affect. Her behavior is normal.  Nursing note and vitals reviewed.   ED Course  Procedures  Clindamycin 300 mg PO MDM  44 y.o. female with dental pain, caries and abscess, stable for d/c without trismus, no difficulty swallowing. Will treat with antibiotics and pain medication and patient will follow up with the dentist tomorrow as scheduled.   Final diagnoses:  Dental abscess      Janne Napoleon, NP 09/26/15 2317  Bethann Berkshire, MD 09/26/15 [phone]

## 2016-01-30 ENCOUNTER — Other Ambulatory Visit (HOSPITAL_COMMUNITY): Payer: Self-pay | Admitting: Neurosurgery

## 2016-01-30 DIAGNOSIS — M545 Low back pain: Secondary | ICD-10-CM

## 2016-02-02 ENCOUNTER — Ambulatory Visit (HOSPITAL_COMMUNITY): Payer: BLUE CROSS/BLUE SHIELD

## 2016-02-20 ENCOUNTER — Ambulatory Visit (HOSPITAL_COMMUNITY): Admission: RE | Admit: 2016-02-20 | Payer: BLUE CROSS/BLUE SHIELD | Source: Ambulatory Visit

## 2016-02-21 ENCOUNTER — Ambulatory Visit (HOSPITAL_COMMUNITY)
Admission: RE | Admit: 2016-02-21 | Discharge: 2016-02-21 | Disposition: A | Discharge status: Home / Self Care | Payer: BLUE CROSS/BLUE SHIELD | Source: Ambulatory Visit | Attending: Neurosurgery | Admitting: Neurosurgery

## 2016-02-21 DIAGNOSIS — M5136 Other intervertebral disc degeneration, lumbar region: Secondary | ICD-10-CM | POA: Insufficient documentation

## 2016-02-21 DIAGNOSIS — M545 Low back pain: Secondary | ICD-10-CM

## 2016-02-21 DIAGNOSIS — M549 Dorsalgia, unspecified: Secondary | ICD-10-CM | POA: Diagnosis present

## 2016-09-17 ENCOUNTER — Ambulatory Visit: Payer: Self-pay | Admitting: Physician Assistant

## 2016-10-23 ENCOUNTER — Ambulatory Visit: Payer: Self-pay | Admitting: Physician Assistant

## 2016-10-28 ENCOUNTER — Ambulatory Visit: Payer: Self-pay | Admitting: Physician Assistant

## 2016-12-23 ENCOUNTER — Ambulatory Visit: Payer: Self-pay | Admitting: Physician Assistant

## 2016-12-24 ENCOUNTER — Encounter: Payer: Self-pay | Admitting: Physician Assistant

## 2017-01-10 ENCOUNTER — Ambulatory Visit: Payer: Self-pay | Admitting: Physician Assistant

## 2017-01-13 ENCOUNTER — Encounter: Payer: Self-pay | Admitting: Physician Assistant

## 2017-02-10 ENCOUNTER — Ambulatory Visit: Payer: Self-pay | Admitting: Physician Assistant

## 2017-02-28 ENCOUNTER — Ambulatory Visit (INDEPENDENT_AMBULATORY_CARE_PROVIDER_SITE_OTHER): Payer: BLUE CROSS/BLUE SHIELD | Admitting: Physician Assistant

## 2017-02-28 ENCOUNTER — Encounter: Payer: Self-pay | Admitting: Physician Assistant

## 2017-02-28 VITALS — BP 122/74 | HR 88 | Temp 97.2°F | Ht 67.0 in | Wt 171.0 lb

## 2017-02-28 DIAGNOSIS — G43809 Other migraine, not intractable, without status migrainosus: Secondary | ICD-10-CM

## 2017-02-28 DIAGNOSIS — F909 Attention-deficit hyperactivity disorder, unspecified type: Secondary | ICD-10-CM | POA: Insufficient documentation

## 2017-02-28 DIAGNOSIS — G44029 Chronic cluster headache, not intractable: Secondary | ICD-10-CM | POA: Insufficient documentation

## 2017-02-28 DIAGNOSIS — F339 Major depressive disorder, recurrent, unspecified: Secondary | ICD-10-CM

## 2017-02-28 DIAGNOSIS — F411 Generalized anxiety disorder: Secondary | ICD-10-CM | POA: Diagnosis not present

## 2017-02-28 DIAGNOSIS — G894 Chronic pain syndrome: Secondary | ICD-10-CM

## 2017-02-28 MED ORDER — AMPHETAMINE-DEXTROAMPHETAMINE 15 MG PO TABS: 1.0000 | ORAL_TABLET | Freq: Two times a day (BID) | ORAL | 0 refills | Status: DC

## 2017-02-28 MED ORDER — SUMATRIPTAN SUCCINATE 100 MG PO TABS: 100.0000 mg | ORAL_TABLET | ORAL | 5 refills | Status: DC | PRN

## 2017-02-28 MED ORDER — RIZATRIPTAN BENZOATE 10 MG PO TBDP: 10.0000 mg | ORAL_TABLET | ORAL | 11 refills | Status: DC | PRN

## 2017-02-28 MED ORDER — SUMATRIPTAN 20 MG/ACT NA SOLN: 20.0000 mg | NASAL | 5 refills | Status: DC | PRN

## 2017-02-28 MED ORDER — RISPERIDONE 0.5 MG PO TABS: 0.5000 mg | ORAL_TABLET | Freq: Every day | ORAL | 5 refills | Status: DC

## 2017-02-28 MED ORDER — VENLAFAXINE HCL ER 37.5 MG PO CP24: 37.5000 mg | ORAL_CAPSULE | Freq: Every day | ORAL | 2 refills | Status: DC

## 2017-02-28 MED ORDER — CLONAZEPAM 0.5 MG PO TABS: 0.5000 mg | ORAL_TABLET | Freq: Two times a day (BID) | ORAL | 0 refills | Status: DC | PRN

## 2017-02-28 NOTE — Patient Instructions (Signed)
[id number] [employer]   Generalized Anxiety Disorder, Adult Generalized anxiety disorder (GAD) is a mental health disorder. People with this condition constantly worry about everyday events. Unlike normal anxiety, worry related to GAD is not triggered by a specific event. These worries also do not fade or get better with time. GAD interferes with life functions, including relationships, work, and school. GAD can vary from mild to severe. People with severe GAD can have intense waves of anxiety with physical symptoms (panic attacks). What are the causes? The exact cause of GAD is not known. What increases the risk? This condition is more likely to develop in:  Women.  People who have a family history of anxiety disorders.  People who are very shy.  People who experience very stressful life events, such as the death of a loved one.  People who have a very stressful family environment. What are the signs or symptoms? People with GAD often worry excessively about many things in their lives, such as their health and family. They may also be overly concerned about:  Doing well at work.  Being on time.  Natural disasters.  Friendships. Physical symptoms of GAD include:  Fatigue.  Muscle tension or having muscle twitches.  Trembling or feeling shaky.  Being easily startled.  Feeling like your heart is pounding or racing.  Feeling out of breath or like you cannot take a deep breath.  Having trouble falling asleep or staying asleep.  Sweating.  Nausea, diarrhea, or irritable bowel syndrome (IBS).  Headaches.  Trouble concentrating or remembering facts.  Restlessness.  Irritability. How is this diagnosed? Your health care provider can diagnose GAD based on your symptoms and medical history. You will also have a physical exam. The health care provider will ask specific questions about your symptoms, including how severe they are, when they started, and if they come and  go. Your health care provider may ask you about your use of alcohol or drugs, including prescription medicines. Your health care provider may refer you to a mental health specialist for further evaluation. Your health care provider will do a thorough examination and may perform additional tests to rule out other possible causes of your symptoms. To be diagnosed with GAD, a person must have anxiety that:  Is out of his or her control.  Affects several different aspects of his or her life, such as work and relationships.  Causes distress that makes him or her unable to take part in normal activities.  Includes at least three physical symptoms of GAD, such as restlessness, fatigue, trouble concentrating, irritability, muscle tension, or sleep problems. Before your health care provider can confirm a diagnosis of GAD, these symptoms must be present more days than they are not, and they must last for six months or longer. How is this treated? The following therapies are usually used to treat GAD:  Medicine. Antidepressant medicine is usually prescribed for long-term daily control. Antianxiety medicines may be added in severe cases, especially when panic attacks occur.  Talk therapy (psychotherapy). Certain types of talk therapy can be helpful in treating GAD by providing support, education, and guidance. Options include:  Cognitive behavioral therapy (CBT). People learn coping skills and techniques to ease their anxiety. They learn to identify unrealistic or negative thoughts and behaviors and to replace them with positive ones.  Acceptance and commitment therapy (ACT). This treatment teaches people how to be mindful as a way to cope with unwanted thoughts and feelings.  Biofeedback. This process trains you  to manage your body's response (physiological response) through breathing techniques and relaxation methods. You will work with a therapist while machines are used to monitor your physical  symptoms.  Stress management techniques. These include yoga, meditation, and exercise. A mental health specialist can help determine which treatment is best for you. Some people see improvement with one type of therapy. However, other people require a combination of therapies. Follow these instructions at home:  Take over-the-counter and prescription medicines only as told by your health care provider.  Try to maintain a normal routine.  Try to anticipate stressful situations and allow extra time to manage them.  Practice any stress management or self-calming techniques as taught by your health care provider.  Do not punish yourself for setbacks or for not making progress.  Try to recognize your accomplishments, even if they are small.  Keep all follow-up visits as told by your health care provider. This is important. Contact a health care provider if:  Your symptoms do not get better.  Your symptoms get worse.  You have signs of depression, such as:  A persistently sad, cranky, or irritable mood.  Loss of enjoyment in activities that used to bring you joy.  Change in weight or eating.  Changes in sleeping habits.  Avoiding friends or family members.  Loss of energy for normal tasks.  Feelings of guilt or worthlessness. Get help right away if:  You have serious thoughts about hurting yourself or others. If you ever feel like you may hurt yourself or others, or have thoughts about taking your own life, get help right away. You can go to your nearest emergency department or call:  Your local emergency services (911 in the [place]  A suicide crisis helpline, such as the National Suicide Prevention Lifeline at 50866445291-801-819-2906. This is open 24 hours a day. Summary  Generalized anxiety disorder (GAD) is a mental health disorder that involves worry that is not triggered by a specific event.  People with GAD often worry excessively about many things in their lives, such as  their health and family.  GAD may cause physical symptoms such as restlessness, trouble concentrating, sleep problems, frequent sweating, nausea, diarrhea, headaches, and trembling or muscle twitching.  A mental health specialist can help determine which treatment is best for you. Some people see improvement with one type of therapy. However, other people require a combination of therapies. This information is not intended to replace advice given to you by your health care provider. Make sure you discuss any questions you have with your health care provider. Document Released: 02/01/2013 Document Revised: 08/27/2016 Document Reviewed: 08/27/2016 Elsevier Interactive Patient Education  2017 [employer]

## 2017-03-03 NOTE — Progress Notes (Signed)
BP 122/74   Pulse 88   Temp 97.2 F (36.2 C) (Oral)   Ht 5\' 7"  (1.702 m)   Wt 171 lb (77.6 kg)   BMI 26.78 kg/m    Subjective:    Patient ID: Nancy Burnett, female    DOB: [birth year], 46 y.o.   MRN: [id number]  Nancy Burnett is a 46 y.o. female presenting on 02/28/2017 for New Patient (Initial Visit) (pt here today to establish care.)  HPI She is coming in as a new patient with multiple medical problems. She has had depression, anxiety, migraine and cluster headaches.  Also chronic lumbar pain and neuropathy.  She is being established here. She has a physician for pain control. I have discussed with her that I cannot do both controlled medications.  She understands and will continue with her pain medications. We will try to reduce her meds as low as possible.  She is currently on no medications for depression and anxiety prevention.   Depression screen PHQ 2/9 02/28/2017  Decreased Interest 2  Down, Depressed, Hopeless 1  PHQ - 2 Score 3  Altered sleeping 0  Tired, decreased energy 3  Change in appetite 3  Feeling bad or failure about yourself  0  Trouble concentrating 3  Moving slowly or fidgety/restless 0  Suicidal thoughts 0  PHQ-9 Score 12     Past Medical History:  Diagnosis Date  . Anxiety   . Elevated WBCs   . Migraines    Relevant past medical, surgical, family and social history reviewed and updated as indicated. Interim medical history since our last visit reviewed. Allergies and medications reviewed and updated.   Data reviewed from any sources in EPIC.  Review of Systems  Constitutional: Negative.  Negative for activity change, fatigue and fever.  HENT: Negative.   Eyes: Negative.   Respiratory: Negative.  Negative for cough.   Cardiovascular: Negative.  Negative for chest pain.  Gastrointestinal: Negative.  Negative for abdominal pain.  Endocrine: Negative.   Genitourinary: Negative.  Negative for dysuria.  Musculoskeletal: Positive for arthralgias,  joint swelling and myalgias.  Skin: Negative.   Neurological: Positive for headaches. Negative for tremors, syncope and weakness.  Psychiatric/Behavioral: Positive for decreased concentration, dysphoric mood and sleep disturbance. Negative for suicidal ideas. The patient is nervous/anxious.      Social History   Social History  . Marital status: Married    Spouse name: N/A  . Number of children: N/A  . Years of education: N/A   Occupational History  . Not on file.   Social History Main Topics  . Smoking status: Current Every Day Smoker    Packs/day: 1.00    Years: 15.00    Types: Cigarettes  . Smokeless tobacco: Never Used  . Alcohol use No  . Drug use: No  . Sexual activity: Yes    Birth control/ protection: Pill, None   Other Topics Concern  . Not on file   Social History Narrative  . No narrative on file    No past surgical history on file.  Family History  Problem Relation Age of Onset  . Hypertension Mother   . Heart failure Mother   . Hyperlipidemia Mother   . Hypertension Father   . Heart failure Father   . Hyperlipidemia Father   . Cancer Sister     Allergies as of 02/28/2017      Reactions   Cephalexin    "does not work"      Medication List  Accurate as of 02/28/17 11:59 PM. Always use your most recent med list.          amphetamine-dextroamphetamine 15 MG tablet Commonly known as:  ADDERALL Take 1 tablet by mouth 2 (two) times daily.   clonazePAM 0.5 MG tablet Commonly known as:  KLONOPIN Take 1 tablet (0.5 mg total) by mouth 2 (two) times daily as needed for anxiety.   gabapentin 800 MG tablet Commonly known as:  NEURONTIN Take 800 mg by mouth 3 (three) times daily.   ibuprofen 600 MG tablet Commonly known as:  ADVIL,MOTRIN Take 1 tablet (600 mg total) by mouth every 6 (six) hours as needed.   Oxycodone HCl 10 MG Tabs Take 10 mg by mouth daily.   risperiDONE 0.5 MG tablet Commonly known as:  RISPERDAL Take 1 tablet  (0.5 mg total) by mouth at bedtime.   rizatriptan 10 MG disintegrating tablet Commonly known as:  MAXALT-MLT Take 1 tablet (10 mg total) by mouth as needed for migraine. May repeat in 2 hours if needed   SUMAtriptan 100 MG tablet Commonly known as:  IMITREX Take 1 tablet (100 mg total) by mouth every 2 (two) hours as needed for migraine.   SUMAtriptan 20 MG/ACT nasal spray Commonly known as:  IMITREX Place 1 spray (20 mg total) into the nose every 2 (two) hours as needed for migraine or headache. May repeat in 2 hours if headache persists or recurs.   venlafaxine XR 37.5 MG 24 hr capsule Commonly known as:  EFFEXOR XR Take 1 capsule (37.5 mg total) by mouth daily with breakfast.          Objective:    BP 122/74   Pulse 88   Temp 97.2 F (36.2 C) (Oral)   Ht 5\' 7"  (1.702 m)   Wt 171 lb (77.6 kg)   BMI 26.78 kg/m   Allergies  Allergen Reactions  . Cephalexin     "does not work"   [employer] Readings from Last 3 Encounters:  02/28/17 171 lb (77.6 kg)  02/21/16 155 lb (70.3 kg)  09/26/15 155 lb (70.3 kg)    Physical Exam  Constitutional: She is oriented to person, place, and time. She appears well-developed and well-nourished.  HENT:  Head: Normocephalic and atraumatic.  Right Ear: Tympanic membrane, external ear and ear canal normal.  Left Ear: Tympanic membrane, external ear and ear canal normal.  Nose: Nose normal. No rhinorrhea.  Mouth/Throat: Oropharynx is clear and moist and mucous membranes are normal. No oropharyngeal exudate or posterior oropharyngeal erythema.  Eyes: Conjunctivae and EOM are normal. Pupils are equal, round, and reactive to light.  Neck: Normal range of motion. Neck supple.  Cardiovascular: Normal rate, regular rhythm, normal heart sounds and intact distal pulses.   Pulmonary/Chest: Effort normal and breath sounds normal.  Abdominal: Soft. Bowel sounds are normal.  Neurological: She is alert and oriented to person, place, and time. She has normal  reflexes. No cranial nerve deficit. Coordination normal.  Skin: Skin is warm and dry. No rash noted.  Psychiatric: She has a normal mood and affect. Her behavior is normal. Judgment and thought content normal.    Results for orders placed or performed during the hospital encounter of 02/11/13  Urine culture  Result Value Ref Range   Specimen Description URINE, CLEAN CATCH    Special Requests NONE    Culture  Setup Time 02/12/2013 03:28    Colony Count NO GROWTH    Culture NO GROWTH    Report Status  02/13/2013 FINAL   Urinalysis, Routine w reflex microscopic  Result Value Ref Range   Color, Urine YELLOW YELLOW   APPearance CLEAR CLEAR   Specific Gravity, Urine 1.020 1.005 - 1.030   pH 6.0 5.0 - 8.0   Glucose, UA NEGATIVE NEGATIVE mg/dL   Hgb urine dipstick SMALL (A) NEGATIVE   Bilirubin Urine NEGATIVE NEGATIVE   Ketones, ur NEGATIVE NEGATIVE mg/dL   Protein, ur NEGATIVE NEGATIVE mg/dL   Urobilinogen, UA 0.2 0.0 - 1.0 mg/dL   Nitrite NEGATIVE NEGATIVE   Leukocytes, UA NEGATIVE NEGATIVE  Pregnancy, urine  Result Value Ref Range   Preg Test, Ur NEGATIVE NEGATIVE  CBC with Differential  Result Value Ref Range   WBC 20.6 (H) 4.0 - 10.5 K/uL   RBC 4.57 3.87 - 5.11 MIL/uL   Hemoglobin 14.1 12.0 - 15.0 g/dL   HCT 10.9 60.4 - 54.0 %   MCV 91.5 78.0 - 100.0 fL   MCH 30.9 26.0 - 34.0 pg   MCHC 33.7 30.0 - 36.0 g/dL   RDW 98.1 19.1 - 47.8 %   Platelets 473 (H) 150 - 400 K/uL   Neutrophils Relative % 78 (H) 43 - 77 %   Lymphocytes Relative 15 12 - 46 %   Monocytes Relative 6 3 - 12 %   Eosinophils Relative 1 0 - 5 %   Basophils Relative 0 0 - 1 %   Neutro Abs 16.1 (H) 1.7 - 7.7 K/uL   Lymphs Abs 3.1 0.7 - 4.0 K/uL   Monocytes Absolute 1.2 (H) 0.1 - 1.0 K/uL   Eosinophils Absolute 0.2 0.0 - 0.7 K/uL   Basophils Absolute 0.0 0.0 - 0.1 K/uL  Basic metabolic panel  Result Value Ref Range   Sodium 139 135 - 145 mEq/L   Potassium 4.5 3.5 - 5.1 mEq/L   Chloride 98 96 - 112 mEq/L    CO2 30 19 - 32 mEq/L   Glucose, Bld 135 (H) 70 - 99 mg/dL   BUN 13 6 - 23 mg/dL   Creatinine, Ser 2.95 0.50 - 1.10 mg/dL   Calcium 9.9 8.4 - 62.1 mg/dL   GFR calc non Af Amer >90 >90 mL/min   GFR calc Af Amer >90 >90 mL/min  Urine microscopic-add on  Result Value Ref Range   Squamous Epithelial / LPF MANY (A) RARE   WBC, UA 0-2 <3 WBC/hpf   RBC / HPF 3-6 <3 RBC/hpf   Bacteria, UA FEW (A) RARE      Assessment & Plan:   1. Chronic cluster headache, not intractable  2. Other migraine without status migrainosus, not intractable - SUMAtriptan (IMITREX) 100 MG tablet; Take 1 tablet (100 mg total) by mouth every 2 (two) hours as needed for migraine.  Dispense: 10 tablet; Refill: 5 - rizatriptan (MAXALT-MLT) 10 MG disintegrating tablet; Take 1 tablet (10 mg total) by mouth as needed for migraine. May repeat in 2 hours if needed  Dispense: 10 tablet; Refill: 11 - SUMAtriptan (IMITREX) 20 MG/ACT nasal spray; Place 1 spray (20 mg total) into the nose every 2 (two) hours as needed for migraine or headache. May repeat in 2 hours if headache persists or recurs.  Dispense: 9 Inhaler; Refill: 5  3. Chronic pain syndrome - gabapentin (NEURONTIN) 800 MG tablet; Take 800 mg by mouth 3 (three) times daily.; Refill: 2  4. Depression, recurrent (HCC) - risperiDONE (RISPERDAL) 0.5 MG tablet; Take 1 tablet (0.5 mg total) by mouth at bedtime.  Dispense: 30 tablet; Refill: 5 -  venlafaxine XR (EFFEXOR XR) 37.5 MG 24 hr capsule; Take 1 capsule (37.5 mg total) by mouth daily with breakfast.  Dispense: 30 capsule; Refill: 2  5. Attention deficit hyperactivity disorder (ADHD), unspecified ADHD type - amphetamine-dextroamphetamine (ADDERALL) 15 MG tablet; Take 1 tablet by mouth 2 (two) times daily.  Dispense: 60 tablet; Refill: 0  6. GAD (generalized anxiety disorder) - clonazePAM (KLONOPIN) 0.5 MG tablet; Take 1 tablet (0.5 mg total) by mouth 2 (two) times daily as needed for anxiety.  Dispense: 45 tablet;  Refill: 0   Continue all other maintenance medications as listed above. Educational handout given for generalized anxiety  Follow up plan: Return in about 4 weeks (around 03/28/2017) for recheck.  Remus Loffler PA-C [facility] [address]  [place], [place] [address] [phone]   03/03/2017, 11:49 AM

## 2017-03-06 ENCOUNTER — Telehealth: Payer: Self-pay | Admitting: Physician Assistant

## 2017-03-06 NOTE — Telephone Encounter (Signed)
Per Prudy FeelerAngel Burnett pt has changed to our office for PCP Okay to fill Clonazepam Beth with [facility] notified

## 2017-03-06 NOTE — Telephone Encounter (Signed)
Can you find out the issue please?

## 2017-03-06 NOTE — Telephone Encounter (Signed)
Please address

## 2017-03-26 ENCOUNTER — Encounter: Payer: Self-pay | Admitting: Physician Assistant

## 2017-03-26 ENCOUNTER — Ambulatory Visit (INDEPENDENT_AMBULATORY_CARE_PROVIDER_SITE_OTHER): Payer: BLUE CROSS/BLUE SHIELD | Admitting: Physician Assistant

## 2017-03-26 VITALS — BP 133/87 | HR 81 | Temp 97.0°F | Ht 67.0 in | Wt 164.2 lb

## 2017-03-26 DIAGNOSIS — G43809 Other migraine, not intractable, without status migrainosus: Secondary | ICD-10-CM

## 2017-03-26 DIAGNOSIS — M797 Fibromyalgia: Secondary | ICD-10-CM

## 2017-03-26 DIAGNOSIS — F411 Generalized anxiety disorder: Secondary | ICD-10-CM | POA: Diagnosis not present

## 2017-03-26 DIAGNOSIS — G894 Chronic pain syndrome: Secondary | ICD-10-CM | POA: Diagnosis not present

## 2017-03-26 DIAGNOSIS — F339 Major depressive disorder, recurrent, unspecified: Secondary | ICD-10-CM

## 2017-03-26 DIAGNOSIS — F909 Attention-deficit hyperactivity disorder, unspecified type: Secondary | ICD-10-CM

## 2017-03-26 MED ORDER — CLONAZEPAM 0.5 MG PO TABS: 0.5000 mg | ORAL_TABLET | Freq: Two times a day (BID) | ORAL | 2 refills | Status: DC | PRN

## 2017-03-26 MED ORDER — AMPHETAMINE-DEXTROAMPHET ER 15 MG PO CP24: 15.0000 mg | ORAL_CAPSULE | ORAL | 0 refills | Status: DC

## 2017-03-26 MED ORDER — RISPERIDONE 1 MG PO TABS: 1.0000 mg | ORAL_TABLET | Freq: Every day | ORAL | 5 refills | Status: DC

## 2017-03-26 MED ORDER — AMPHETAMINE-DEXTROAMPHETAMINE 15 MG PO TABS: 1.0000 | ORAL_TABLET | Freq: Two times a day (BID) | ORAL | 0 refills | Status: DC

## 2017-03-26 NOTE — Patient Instructions (Signed)
In a few days you may receive a survey in the mail or online from [employer] regarding your visit with us today. Please take a moment to fill this out. Your feedback is very important to our whole office. It can help us better understand your needs as well as improve your experience and satisfaction. Thank you for taking your time to complete it. We care about you.  Jacqulin Brandenburger, PA-C  

## 2017-03-31 ENCOUNTER — Ambulatory Visit: Payer: BLUE CROSS/BLUE SHIELD | Admitting: Physician Assistant

## 2017-03-31 NOTE — Progress Notes (Signed)
BP 133/87   Pulse 81   Temp 97 F (36.1 C) (Oral)   Ht 5\' 7"  (1.702 m)   Wt 164 lb 3.2 oz (74.5 kg)   BMI 25.72 kg/m    Subjective:    Patient ID: Nancy Burnett, female    DOB: 1971/05/30, 46 y.o.   MRN: [id number]  HPI: [name] is a 46 y.o. female presenting on 03/26/2017 for Follow-up (4 week)   This patient comes in for periodic recheck on medications and conditions including migraine, GAD, fibromyalgia, chronic pain, ADHD, depression.  She is stable overall except great stress due her husband's health issues and workman's comp case.  It is still undergoing.   All medications are reviewed today. There are no reports of any problems with the medications. All of the medical conditions are reviewed and updated.  Lab work is reviewed and will be ordered as medically necessary. There are no new problems reported with today's visit.   Relevant past medical, surgical, family and social history reviewed and updated as indicated. Allergies and medications reviewed and updated.  Past Medical History:  Diagnosis Date  . Anxiety   . Elevated WBCs   . Migraines     History reviewed. No pertinent surgical history.  Review of Systems  Constitutional: Negative.  Negative for activity change, fatigue and fever.  HENT: Negative.   Eyes: Negative.   Respiratory: Negative.  Negative for cough.   Cardiovascular: Negative.  Negative for chest pain.  Gastrointestinal: Negative.  Negative for abdominal pain.  Endocrine: Negative.   Genitourinary: Negative.  Negative for dysuria.  Musculoskeletal: Negative.   Skin: Negative.   Neurological: Negative.     Allergies as of 03/26/2017      Reactions   Cephalexin    "does not work"      Medication List       Accurate as of 03/26/17 11:59 PM. Always use your most recent med list.          amphetamine-dextroamphetamine 15 MG tablet Commonly known as:  ADDERALL Take 1 tablet by mouth 2 (two) times daily.     amphetamine-dextroamphetamine 15 MG 24 hr capsule Commonly known as:  ADDERALL XR Take 1 capsule by mouth every morning.   amphetamine-dextroamphetamine 15 MG 24 hr capsule Commonly known as:  ADDERALL XR Take 1 capsule by mouth every morning.   clonazePAM 0.5 MG tablet Commonly known as:  KLONOPIN Take 1 tablet (0.5 mg total) by mouth 2 (two) times daily as needed for anxiety.   gabapentin 800 MG tablet Commonly known as:  NEURONTIN Take 800 mg by mouth 3 (three) times daily.   ibuprofen 600 MG tablet Commonly known as:  ADVIL,MOTRIN Take 1 tablet (600 mg total) by mouth every 6 (six) hours as needed.   Oxycodone HCl 10 MG Tabs Take 10 mg by mouth daily.   risperiDONE 1 MG tablet Commonly known as:  RISPERDAL Take 1-2 tablets (1-2 mg total) by mouth at bedtime.   rizatriptan 10 MG disintegrating tablet Commonly known as:  MAXALT-MLT Take 1 tablet (10 mg total) by mouth as needed for migraine. May repeat in 2 hours if needed   SUMAtriptan 100 MG tablet Commonly known as:  IMITREX Take 1 tablet (100 mg total) by mouth every 2 (two) hours as needed for migraine.   SUMAtriptan 20 MG/ACT nasal spray Commonly known as:  IMITREX Place 1 spray (20 mg total) into the nose every 2 (two) hours as needed for migraine or headache. May  repeat in 2 hours if headache persists or recurs.   venlafaxine XR 37.5 MG 24 hr capsule Commonly known as:  EFFEXOR XR Take 1 capsule (37.5 mg total) by mouth daily with breakfast.          Objective:    BP 133/87   Pulse 81   Temp 97 F (36.1 C) (Oral)   Ht 5\' 7"  (1.702 m)   Wt 164 lb 3.2 oz (74.5 kg)   BMI 25.72 kg/m   Allergies  Allergen Reactions  . Cephalexin     "does not work"    Physical Exam  Constitutional: She is oriented to person, place, and time. She appears well-developed and well-nourished.  HENT:  Head: Normocephalic and atraumatic.  Eyes: Conjunctivae and EOM are normal. Pupils are equal, round, and reactive to  light.  Cardiovascular: Normal rate, regular rhythm, normal heart sounds and intact distal pulses.   Pulmonary/Chest: Effort normal and breath sounds normal.  Abdominal: Soft. Bowel sounds are normal.  Neurological: She is alert and oriented to person, place, and time. She has normal reflexes.  Skin: Skin is warm and dry. No rash noted.  Psychiatric: She has a normal mood and affect. Her behavior is normal. Judgment and thought content normal.  Nursing note and vitals reviewed.       Assessment & Plan:   1. Other migraine without status migrainosus, not intractable  2. GAD (generalized anxiety disorder) - clonazePAM (KLONOPIN) 0.5 MG tablet; Take 1 tablet (0.5 mg total) by mouth 2 (two) times daily as needed for anxiety.  Dispense: 45 tablet; Refill: 2  3. Fibromyalgia  4. Chronic pain syndrome  5. Attention deficit hyperactivity disorder (ADHD), unspecified ADHD type - amphetamine-dextroamphetamine (ADDERALL) 15 MG tablet; Take 1 tablet by mouth 2 (two) times daily.  Dispense: 60 tablet; Refill: 0 - amphetamine-dextroamphetamine (ADDERALL XR) 15 MG 24 hr capsule; Take 1 capsule by mouth every morning.  Dispense: 30 capsule; Refill: 0 - amphetamine-dextroamphetamine (ADDERALL XR) 15 MG 24 hr capsule; Take 1 capsule by mouth every morning.  Dispense: 30 capsule; Refill: 0  6. Depression, recurrent (HCC) - risperiDONE (RISPERDAL) 1 MG tablet; Take 1-2 tablets (1-2 mg total) by mouth at bedtime.  Dispense: 60 tablet; Refill: 5   Current Outpatient Prescriptions:  .  amphetamine-dextroamphetamine (ADDERALL) 15 MG tablet, Take 1 tablet by mouth 2 (two) times daily., Disp: 60 tablet, Rfl: 0 .  clonazePAM (KLONOPIN) 0.5 MG tablet, Take 1 tablet (0.5 mg total) by mouth 2 (two) times daily as needed for anxiety., Disp: 45 tablet, Rfl: 2 .  gabapentin (NEURONTIN) 800 MG tablet, Take 800 mg by mouth 3 (three) times daily., Disp: , Rfl: 2 .  ibuprofen (ADVIL,MOTRIN) 600 MG tablet, Take 1  tablet (600 mg total) by mouth every 6 (six) hours as needed., Disp: 30 tablet, Rfl: 0 .  Oxycodone HCl 10 MG TABS, Take 10 mg by mouth daily. , Disp: , Rfl: 0 .  risperiDONE (RISPERDAL) 1 MG tablet, Take 1-2 tablets (1-2 mg total) by mouth at bedtime., Disp: 60 tablet, Rfl: 5 .  rizatriptan (MAXALT-MLT) 10 MG disintegrating tablet, Take 1 tablet (10 mg total) by mouth as needed for migraine. May repeat in 2 hours if needed, Disp: 10 tablet, Rfl: 11 .  SUMAtriptan (IMITREX) 100 MG tablet, Take 1 tablet (100 mg total) by mouth every 2 (two) hours as needed for migraine., Disp: 10 tablet, Rfl: 5 .  SUMAtriptan (IMITREX) 20 MG/ACT nasal spray, Place 1 spray (20 mg total)  into the nose every 2 (two) hours as needed for migraine or headache. May repeat in 2 hours if headache persists or recurs., Disp: 9 Inhaler, Rfl: 5 .  venlafaxine XR (EFFEXOR XR) 37.5 MG 24 hr capsule, Take 1 capsule (37.5 mg total) by mouth daily with breakfast., Disp: 30 capsule, Rfl: 2 .  amphetamine-dextroamphetamine (ADDERALL XR) 15 MG 24 hr capsule, Take 1 capsule by mouth every morning., Disp: 30 capsule, Rfl: 0 .  amphetamine-dextroamphetamine (ADDERALL XR) 15 MG 24 hr capsule, Take 1 capsule by mouth every morning., Disp: 30 capsule, Rfl: 0  Continue all other maintenance medications as listed above.  Follow up plan: Return in about 3 months (around 06/26/2017) for recheck.  Educational handout given for survey  Remus LofflerAngel S. Valerye Kobus PA-C [facility] [address]  [place], [place] [id number] [phone]   03/31/2017, 10:14 PM

## 2017-05-26 ENCOUNTER — Ambulatory Visit: Payer: BLUE CROSS/BLUE SHIELD | Admitting: Physician Assistant

## 2017-08-13 ENCOUNTER — Ambulatory Visit: Payer: BLUE CROSS/BLUE SHIELD | Admitting: Physician Assistant

## 2017-08-29 ENCOUNTER — Ambulatory Visit (INDEPENDENT_AMBULATORY_CARE_PROVIDER_SITE_OTHER): Payer: Medicare Other | Admitting: Physician Assistant

## 2017-08-29 ENCOUNTER — Encounter: Payer: Self-pay | Admitting: Physician Assistant

## 2017-08-29 DIAGNOSIS — F339 Major depressive disorder, recurrent, unspecified: Secondary | ICD-10-CM

## 2017-08-29 DIAGNOSIS — S6991XA Unspecified injury of right wrist, hand and finger(s), initial encounter: Secondary | ICD-10-CM

## 2017-08-29 DIAGNOSIS — F909 Attention-deficit hyperactivity disorder, unspecified type: Secondary | ICD-10-CM | POA: Diagnosis not present

## 2017-08-29 MED ORDER — AMPHETAMINE-DEXTROAMPHETAMINE 15 MG PO TABS: 15.0000 mg | ORAL_TABLET | Freq: Every day | ORAL | 0 refills | Status: DC

## 2017-08-29 MED ORDER — VENLAFAXINE HCL ER 37.5 MG PO CP24: 37.5000 mg | ORAL_CAPSULE | Freq: Every day | ORAL | 2 refills | Status: DC

## 2017-08-29 MED ORDER — AMPHETAMINE-DEXTROAMPHETAMINE 15 MG PO TABS: 1.0000 | ORAL_TABLET | Freq: Two times a day (BID) | ORAL | 0 refills | Status: DC

## 2017-08-29 MED ORDER — CYCLOBENZAPRINE HCL 10 MG PO TABS: 10.0000 mg | ORAL_TABLET | Freq: Three times a day (TID) | ORAL | 0 refills | Status: DC | PRN

## 2017-08-29 MED ORDER — ALPRAZOLAM 0.5 MG PO TABS: 0.5000 mg | ORAL_TABLET | Freq: Two times a day (BID) | ORAL | 2 refills | Status: DC | PRN

## 2017-08-29 NOTE — Patient Instructions (Signed)
In a few days you may receive a survey in the mail or online from [employer] regarding your visit with us today. Please take a moment to fill this out. Your feedback is very important to our whole office. It can help us better understand your needs as well as improve your experience and satisfaction. Thank you for taking your time to complete it. We care about you.  Sakina Briones, PA-C  

## 2017-08-29 NOTE — Progress Notes (Signed)
BP 133/84   Pulse 91   Temp 98.3 F (36.8 C) (Oral)   Ht 5\' 7"  (1.702 m)   Wt 166 lb (75.3 kg)   BMI 26.00 kg/m    Subjective:    Patient ID: Nancy Burnett, female    DOB: 06/26/1971, 46 y.o.   MRN: [id number]  HPI: Nancy Burnett is a 46 y.o. female presenting on 08/29/2017 for Follow-up and Medication Refill  This patient comes in for periodic recheck on medications and conditions including ADHD, migraine, anxiety.  All of her medications are reviewed today.  She is doing well with her Adderall.  I prescribed the wrong thing and gave her an Exar version last month.  She does not feel as good on that.  And it had interruption of her sleep.  I have made sure it is corrected to the Adderall immediate release.  All of her medications are reviewed and will be refilled as needed.  There is still a lot of financial stress at her home.  Her husband is not been able to find work again.   Also at the end of September the patient states that she had a forward fall on a sidewalk.  She had the most pain in her right hand.  She did not go to the emergency room.  It has continued to bother her in the mid hand and hurts with movement.  It will be stiff in the morning when she gets up.  She has also had pain up into her neck and back.  She does not know of any degenerative disc in her neck.  She has been wearing a brace periodically. All medications are reviewed today. There are no reports of any problems with the medications. All of the medical conditions are reviewed and updated.  Lab work is reviewed and will be ordered as medically necessary. There are no new problems reported with today's visit.   Relevant past medical, surgical, family and social history reviewed and updated as indicated. Allergies and medications reviewed and updated.  Past Medical History:  Diagnosis Date  . Anxiety   . Elevated WBCs   . Migraines     History reviewed. No pertinent surgical history.  Review of Systems    Constitutional: Negative.   HENT: Negative.   Eyes: Negative.   Respiratory: Negative.   Gastrointestinal: Negative.   Genitourinary: Negative.     Allergies as of 08/29/2017      Reactions   Cephalexin    "does not work"      Medication List        Accurate as of 08/29/17 11:37 AM. Always use your most recent med list.          ALPRAZolam 0.5 MG tablet Commonly known as:  XANAX Take 1 tablet (0.5 mg total) 2 (two) times daily as needed by mouth for anxiety.   amphetamine-dextroamphetamine 15 MG 24 hr capsule Commonly known as:  ADDERALL XR Take 1 capsule by mouth every morning.   amphetamine-dextroamphetamine 15 MG 24 hr capsule Commonly known as:  ADDERALL XR Take 1 capsule by mouth every morning.   amphetamine-dextroamphetamine 15 MG tablet Commonly known as:  ADDERALL Take 1 tablet 2 (two) times daily by mouth.   amphetamine-dextroamphetamine 15 MG tablet Commonly known as:  ADDERALL Take 1 tablet daily by mouth.   amphetamine-dextroamphetamine 15 MG tablet Commonly known as:  ADDERALL Take 1 tablet daily by mouth.   cyclobenzaprine 10 MG tablet Commonly known as:  FLEXERIL Take 1 tablet (10 mg total) 3 (three) times daily as needed by mouth for muscle spasms.   gabapentin 800 MG tablet Commonly known as:  NEURONTIN Take 800 mg by mouth 3 (three) times daily.   ibuprofen 600 MG tablet Commonly known as:  ADVIL,MOTRIN Take 1 tablet (600 mg total) by mouth every 6 (six) hours as needed.   LYRICA 150 MG capsule Generic drug:  pregabalin Take 150 mg 2 (two) times daily by mouth.   Oxycodone HCl 10 MG Tabs Take 10 mg by mouth daily.   risperiDONE 1 MG tablet Commonly known as:  RISPERDAL Take 1-2 tablets (1-2 mg total) by mouth at bedtime.   rizatriptan 10 MG disintegrating tablet Commonly known as:  MAXALT-MLT Take 1 tablet (10 mg total) by mouth as needed for migraine. May repeat in 2 hours if needed   SUMAtriptan 100 MG tablet Commonly known  as:  IMITREX Take 1 tablet (100 mg total) by mouth every 2 (two) hours as needed for migraine.   SUMAtriptan 20 MG/ACT nasal spray Commonly known as:  IMITREX Place 1 spray (20 mg total) into the nose every 2 (two) hours as needed for migraine or headache. May repeat in 2 hours if headache persists or recurs.   venlafaxine XR 37.5 MG 24 hr capsule Commonly known as:  EFFEXOR XR Take 1 capsule (37.5 mg total) daily with breakfast by mouth.          Objective:    BP 133/84   Pulse 91   Temp 98.3 F (36.8 C) (Oral)   Ht 5\' 7"  (1.702 m)   Wt 166 lb (75.3 kg)   BMI 26.00 kg/m   Allergies  Allergen Reactions  . Cephalexin     "does not work"    Physical Exam  Constitutional: She is oriented to person, place, and time. She appears well-developed and well-nourished.  HENT:  Head: Normocephalic and atraumatic.  Eyes: Conjunctivae and EOM are normal. Pupils are equal, round, and reactive to light.  Cardiovascular: Normal rate, regular rhythm, normal heart sounds and intact distal pulses.  Pulmonary/Chest: Effort normal and breath sounds normal.  Abdominal: Soft. Bowel sounds are normal.  Musculoskeletal:       Right hand: She exhibits tenderness. She exhibits normal range of motion, no bony tenderness and no deformity. Normal sensation noted. Normal strength noted.       Hands: Neurological: She is alert and oriented to person, place, and time. She has normal reflexes.  Skin: Skin is warm and dry. No rash noted.  Psychiatric: She has a normal mood and affect. Her behavior is normal. Judgment and thought content normal.  Nursing note and vitals reviewed.       Assessment & Plan:   1. Depression, recurrent (HCC) - venlafaxine XR (EFFEXOR XR) 37.5 MG 24 hr capsule; Take 1 capsule (37.5 mg total) daily with breakfast by mouth.  Dispense: 30 capsule; Refill: 2  2. Attention deficit hyperactivity disorder (ADHD), unspecified ADHD type - amphetamine-dextroamphetamine (ADDERALL)  15 MG tablet; Take 1 tablet 2 (two) times daily by mouth.  Dispense: 60 tablet; Refill: 0  3. Hand injury, right, initial encounter Ice and rest Wear brace regularly for 4 weeks   Current Outpatient Medications:  .  amphetamine-dextroamphetamine (ADDERALL XR) 15 MG 24 hr capsule, Take 1 capsule by mouth every morning., Disp: 30 capsule, Rfl: 0 .  amphetamine-dextroamphetamine (ADDERALL XR) 15 MG 24 hr capsule, Take 1 capsule by mouth every morning., Disp: 30 capsule, Rfl:  0 .  amphetamine-dextroamphetamine (ADDERALL) 15 MG tablet, Take 1 tablet 2 (two) times daily by mouth., Disp: 60 tablet, Rfl: 0 .  gabapentin (NEURONTIN) 800 MG tablet, Take 800 mg by mouth 3 (three) times daily., Disp: , Rfl: 2 .  ibuprofen (ADVIL,MOTRIN) 600 MG tablet, Take 1 tablet (600 mg total) by mouth every 6 (six) hours as needed., Disp: 30 tablet, Rfl: 0 .  Oxycodone HCl 10 MG TABS, Take 10 mg by mouth daily. , Disp: , Rfl: 0 .  rizatriptan (MAXALT-MLT) 10 MG disintegrating tablet, Take 1 tablet (10 mg total) by mouth as needed for migraine. May repeat in 2 hours if needed, Disp: 10 tablet, Rfl: 11 .  SUMAtriptan (IMITREX) 100 MG tablet, Take 1 tablet (100 mg total) by mouth every 2 (two) hours as needed for migraine., Disp: 10 tablet, Rfl: 5 .  SUMAtriptan (IMITREX) 20 MG/ACT nasal spray, Place 1 spray (20 mg total) into the nose every 2 (two) hours as needed for migraine or headache. May repeat in 2 hours if headache persists or recurs., Disp: 9 Inhaler, Rfl: 5 .  venlafaxine XR (EFFEXOR XR) 37.5 MG 24 hr capsule, Take 1 capsule (37.5 mg total) daily with breakfast by mouth., Disp: 30 capsule, Rfl: 2 .  ALPRAZolam (XANAX) 0.5 MG tablet, Take 1 tablet (0.5 mg total) 2 (two) times daily as needed by mouth for anxiety., Disp: 60 tablet, Rfl: 2 .  amphetamine-dextroamphetamine (ADDERALL) 15 MG tablet, Take 1 tablet daily by mouth., Disp: 60 tablet, Rfl: 0 .  amphetamine-dextroamphetamine (ADDERALL) 15 MG tablet, Take 1  tablet daily by mouth., Disp: 60 tablet, Rfl: 0 .  cyclobenzaprine (FLEXERIL) 10 MG tablet, Take 1 tablet (10 mg total) 3 (three) times daily as needed by mouth for muscle spasms., Disp: 60 tablet, Rfl: 0 .  LYRICA 150 MG capsule, Take 150 mg 2 (two) times daily by mouth., Disp: , Rfl: 3 .  risperiDONE (RISPERDAL) 1 MG tablet, Take 1-2 tablets (1-2 mg total) by mouth at bedtime. (Patient not taking: Reported on 08/29/2017), Disp: 60 tablet, Rfl: 5 Continue all other maintenance medications as listed above.  Follow up plan: Return in about 3 months (around 11/29/2017) for recheck.  Educational handout given for survey  Remus LofflerAngel S. Rosely Fernandez PA-C [facility] [address]  [place], [place] [id number] [phone]   08/29/2017, 11:37 AM

## 2017-11-26 ENCOUNTER — Encounter: Payer: Self-pay | Admitting: Physician Assistant

## 2017-11-26 ENCOUNTER — Ambulatory Visit (INDEPENDENT_AMBULATORY_CARE_PROVIDER_SITE_OTHER): Payer: Medicare Other | Admitting: Physician Assistant

## 2017-11-26 VITALS — BP 135/85 | HR 122 | Temp 97.9°F | Ht 67.0 in | Wt 158.0 lb

## 2017-11-26 DIAGNOSIS — M5136 Other intervertebral disc degeneration, lumbar region: Secondary | ICD-10-CM

## 2017-11-26 DIAGNOSIS — M51369 Other intervertebral disc degeneration, lumbar region without mention of lumbar back pain or lower extremity pain: Secondary | ICD-10-CM

## 2017-11-26 DIAGNOSIS — F909 Attention-deficit hyperactivity disorder, unspecified type: Secondary | ICD-10-CM | POA: Diagnosis not present

## 2017-11-26 DIAGNOSIS — G43809 Other migraine, not intractable, without status migrainosus: Secondary | ICD-10-CM

## 2017-11-26 DIAGNOSIS — M542 Cervicalgia: Secondary | ICD-10-CM

## 2017-11-26 DIAGNOSIS — F339 Major depressive disorder, recurrent, unspecified: Secondary | ICD-10-CM

## 2017-11-26 DIAGNOSIS — F411 Generalized anxiety disorder: Secondary | ICD-10-CM | POA: Diagnosis not present

## 2017-11-26 MED ORDER — AMPHETAMINE-DEXTROAMPHETAMINE 15 MG PO TABS: 15.0000 mg | ORAL_TABLET | Freq: Every day | ORAL | 0 refills | Status: DC

## 2017-11-26 MED ORDER — ALPRAZOLAM 0.5 MG PO TABS: 0.5000 mg | ORAL_TABLET | Freq: Two times a day (BID) | ORAL | 5 refills | Status: DC | PRN

## 2017-11-26 MED ORDER — OXYCODONE HCL 10 MG PO TABS: 10.0000 mg | ORAL_TABLET | Freq: Four times a day (QID) | ORAL | 0 refills | Status: DC

## 2017-11-26 MED ORDER — AMPHETAMINE-DEXTROAMPHETAMINE 15 MG PO TABS: 1.0000 | ORAL_TABLET | Freq: Two times a day (BID) | ORAL | 0 refills | Status: DC

## 2017-11-26 MED ORDER — RISPERIDONE 1 MG PO TABS
0.5000 mg | ORAL_TABLET | Freq: Every day | ORAL | 5 refills | Status: DC
Start: 2017-11-26 — End: 2018-03-11

## 2017-11-26 NOTE — Patient Instructions (Addendum)
In a few days you may receive a survey in the mail or online from [employer] regarding your visit with us today. Please take a moment to fill this out. Your feedback is very important to our whole office. It can help us better understand your needs as well as improve your experience and satisfaction. Thank you for taking your time to complete it. We care about you.  Prudy Feelerngel Jayzen Paver, PA-C    [facility] [phone]

## 2017-11-27 NOTE — Progress Notes (Signed)
BP 135/85   Pulse (!) 122   Temp 97.9 F (36.6 C) (Oral)   Ht 5\' 7"  (1.702 m)   Wt 158 lb (71.7 kg)   BMI 24.75 kg/m     Subjective:    Patient ID: Nancy Burnett, female    DOB: 04/11/1971, 47 y.o.   MRN: [id number]  HPI: Nancy Burnett is a 47 y.o. female presenting on 11/26/2017 for Follow-up (3 month )  This patient comes in for periodic recheck on medications and conditions including Is ready for is is is is is why can you mo Casimiro Needle degenerative disease lumbar, chronic migraine, generalized anxiety, depression, ADHD.  All of her medications are reviewed today.  She states she is tolerating her medications well.  She has become a  full time patient here. Depression screen [facility] 2/9 11/26/2017 08/29/2017 03/26/2017 02/28/2017  Decreased Interest 0 2 2 2   Down, Depressed, Hopeless 0 1 1 1   PHQ - 2 Score 0 3 3 3   Altered sleeping - 0 0 0  Tired, decreased energy - 3 3 3   Change in appetite - 2 3 3   Feeling bad or failure about yourself  - 0 0 0  Trouble concentrating - 3 3 3   Moving slowly or fidgety/restless - 0 0 0  Suicidal thoughts - 0 0 0  PHQ-9 Score - 11 12 12    .   All medications are reviewed today. There are no reports of any problems with the medications. All of the medical conditions are reviewed and updated.  Lab work is reviewed and will be ordered as medically necessary. There are no new problems reported with today's visit. Complains of paink Past Medical History:  Diagnosis Date  . Anxiety   . Elevated WBCs   . Migraines    Relevant past medical, surgical, family and social history reviewed and updated as indicated. Interim medical history since our last visit reviewed. Allergies and medications reviewed and updated. DATA REVIEWED: CHART IN EPIC  Family History reviewed for pertinent findings.  Review of Systems  Constitutional: Negative.  Negative for activity change, fatigue and fever.  HENT: Negative.   Eyes: Negative.   Respiratory: Negative.  Negative  for cough.   Cardiovascular: Negative.  Negative for chest pain.  Gastrointestinal: Negative.  Negative for abdominal pain.  Endocrine: Negative.   Genitourinary: Negative.  Negative for dysuria.  Musculoskeletal: Negative.   Skin: Negative.   Neurological: Negative.     Allergies as of 11/26/2017      Reactions   Cephalexin    "does not work"      Medication List        Accurate as of 11/26/17 11:59 PM. Always use your most recent med list.          ALPRAZolam 0.5 MG tablet Commonly known as:  XANAX Take 1 tablet (0.5 mg total) by mouth 2 (two) times daily as needed for anxiety.   amphetamine-dextroamphetamine 15 MG tablet Commonly known as:  ADDERALL Take 1 tablet by mouth 2 (two) times daily.   amphetamine-dextroamphetamine 15 MG tablet Commonly known as:  ADDERALL Take 1 tablet by mouth daily.   amphetamine-dextroamphetamine 15 MG tablet Commonly known as:  ADDERALL Take 1 tablet by mouth daily.   cyclobenzaprine 10 MG tablet Commonly known as:  FLEXERIL Take 1 tablet (10 mg total) 3 (three) times daily as needed by mouth for muscle spasms.   ibuprofen 600 MG tablet Commonly known as:  ADVIL,MOTRIN Take 1 tablet (  600 mg total) by mouth every 6 (six) hours as needed.   LYRICA 150 MG capsule Generic drug:  pregabalin Take 150 mg 2 (two) times daily by mouth.   Oxycodone HCl 10 MG Tabs Take 1 tablet (10 mg total) by mouth 4 (four) times daily.   risperiDONE 1 MG tablet Commonly known as:  RISPERDAL Take 0.5-1 tablets (0.5-1 mg total) by mouth at bedtime.   rizatriptan 10 MG disintegrating tablet Commonly known as:  MAXALT-MLT Take 1 tablet (10 mg total) by mouth as needed for migraine. May repeat in 2 hours if needed   SUMAtriptan 100 MG tablet Commonly known as:  IMITREX Take 1 tablet (100 mg total) by mouth every 2 (two) hours as needed for migraine.   venlafaxine XR 37.5 MG 24 hr capsule Commonly known as:  EFFEXOR XR Take 1 capsule (37.5 mg total)  daily with breakfast by mouth.          Objective:    BP 135/85   Pulse (!) 122   Temp 97.9 F (36.6 C) (Oral)   Ht 5\' 7"  (1.702 m)   Wt 158 lb (71.7 kg)   BMI 24.75 kg/m    Allergies  Allergen Reactions  . Cephalexin     "does not work"    [employer] Read[employer] from Last 3 Encounters:  11/26/17 158 lb (71.7 kg)  08/29/17 166 lb (75.3 kg)  03/26/17 164 lb 3.2 oz (74.5 kg)    Physical Exam  Constitutional: She is oriented to person, place, and time. She appears well-developed and well-nourished.  HENT:  Head: Normocephalic and atraumatic.  Right Ear: Tympanic membrane, external ear and ear canal normal.  Left Ear: Tympanic membrane, external ear and ear canal normal.  Nose: Nose normal. No rhinorrhea.  Mouth/Throat: Oropharynx is clear and moist and mucous membranes are normal. No oropharyngeal exudate or posterior oropharyngeal erythema.  Eyes: Conjunctivae and EOM are normal. Pupils are equal, round, and reactive to light.  Neck: Normal range of motion. Neck supple.  Cardiovascular: Normal rate, regular rhythm, normal heart sounds and intact distal pulses.  Pulmonary/Chest: Effort normal and breath sounds normal.  Abdominal: Soft. Bowel sounds are normal.  Neurological: She is alert and oriented to person, place, and time. She has normal reflexes.  Skin: Skin is warm and dry. No rash noted.  Psychiatric: She has a normal mood and affect. Her behavior is normal. Judgment and thought content normal.  Nursing note and vitals reviewed.   Results for orders placed or performed during the hospital encounter of 02/11/13  Urine culture  Result Value Ref Range   Specimen Description URINE, CLEAN CATCH    Special Requests NONE    Culture  Setup Time 02/12/2013 03:28    Colony Count NO GROWTH    Culture NO GROWTH    Report Status 02/13/2013 FINAL   Urinalysis, Routine w reflex microscopic  Result Value Ref Range   Color, Urine YELLOW YELLOW   APPearance CLEAR CLEAR   Specific  Gravity, Urine 1.020 1.005 - 1.030   pH 6.0 5.0 - 8.0   Glucose, UA NEGATIVE NEGATIVE mg/dL   Hgb urine dipstick SMALL (A) NEGATIVE   Bilirubin Urine NEGATIVE NEGATIVE   Ketones, ur NEGATIVE NEGATIVE mg/dL   Protein, ur NEGATIVE NEGATIVE mg/dL   Urobilinogen, UA 0.2 0.0 - 1.0 mg/dL   Nitrite NEGATIVE NEGATIVE   Leukocytes, UA NEGATIVE NEGATIVE  Pregnancy, urine  Result Value Ref Range   Preg Test, Ur NEGATIVE NEGATIVE  CBC with  Differential  Result Value Ref Range   WBC 20.6 (H) 4.0 - 10.5 K/uL   RBC 4.57 3.87 - 5.11 MIL/uL   Hemoglobin 14.1 12.0 - 15.0 g/dL   HCT 16.1 09.6 - 04.5 %   MCV 91.5 78.0 - 100.0 fL   MCH 30.9 26.0 - 34.0 pg   MCHC 33.7 30.0 - 36.0 g/dL   RDW 40.9 81.1 - 91.4 %   Platelets 473 (H) 150 - 400 K/uL   Neutrophils Relative % 78 (H) 43 - 77 %   Lymphocytes Relative 15 12 - 46 %   Monocytes Relative 6 3 - 12 %   Eosinophils Relative 1 0 - 5 %   Basophils Relative 0 0 - 1 %   Neutro Abs 16.1 (H) 1.7 - 7.7 K/uL   Lymphs Abs 3.1 0.7 - 4.0 K/uL   Monocytes Absolute 1.2 (H) 0.1 - 1.0 K/uL   Eosinophils Absolute 0.2 0.0 - 0.7 K/uL   Basophils Absolute 0.0 0.0 - 0.1 K/uL  Basic metabolic panel  Result Value Ref Range   Sodium 139 135 - 145 mEq/L   Potassium 4.5 3.5 - 5.1 mEq/L   Chloride 98 96 - 112 mEq/L   CO2 30 19 - 32 mEq/L   Glucose, Bld 135 (H) 70 - 99 mg/dL   BUN 13 6 - 23 mg/dL   Creatinine, Ser 7.82 0.50 - 1.10 mg/dL   Calcium 9.9 8.4 - 95.6 mg/dL   GFR calc non Af Amer >90 >90 mL/min   GFR calc Af Amer >90 >90 mL/min  Urine microscopic-add on  Result Value Ref Range   Squamous Epithelial / LPF MANY (A) RARE   WBC, UA 0-2 <3 WBC/hpf   RBC / HPF 3-6 <3 RBC/hpf   Bacteria, UA FEW (A) RARE      Assessment & Plan:   1. DDD (degenerative disc disease), lumbar - Oxycodone HCl 10 MG TABS; Take 1 tablet (10 mg total) by mouth 4 (four) times daily.  Dispense: 120 tablet; Refill: 0  2. Other migraine without status migrainosus, not  intractable  3. GAD (generalized anxiety disorder) - ALPRAZolam (XANAX) 0.5 MG tablet; Take 1 tablet (0.5 mg total) by mouth 2 (two) times daily as needed for anxiety.  Dispense: 60 tablet; Refill: 5  4. Depression, recurrent (HCC) - risperiDONE (RISPERDAL) 1 MG tablet; Take 0.5-1 tablets (0.5-1 mg total) by mouth at bedtime.  Dispense: 30 tablet; Refill: 5  5. Attention deficit hyperactivity disorder (ADHD), unspecified ADHD type - amphetamine-dextroamphetamine (ADDERALL) 15 MG tablet; Take 1 tablet by mouth 2 (two) times daily.  Dispense: 60 tablet; Refill: 0 - amphetamine-dextroamphetamine (ADDERALL) 15 MG tablet; Take 1 tablet by mouth daily.  Dispense: 60 tablet; Refill: 0 - amphetamine-dextroamphetamine (ADDERALL) 15 MG tablet; Take 1 tablet by mouth daily.  Dispense: 60 tablet; Refill: 0  6. Cervical pain   Continue all other maintenance medications as listed above.  Follow up plan: Return in about 4 weeks (around 12/24/2017) for .recheck.  record releases.  Educational handout given for survey  Remus Loffler PA-C [facility] [address]  [place], [place] [address] [phone]   11/27/2017, 9:00 PM

## 2017-12-19 ENCOUNTER — Encounter: Payer: Self-pay | Admitting: Physician Assistant

## 2017-12-19 ENCOUNTER — Ambulatory Visit (INDEPENDENT_AMBULATORY_CARE_PROVIDER_SITE_OTHER): Payer: Medicare Other | Admitting: Physician Assistant

## 2017-12-19 VITALS — BP 133/83 | HR 103 | Temp 96.7°F | Ht 67.0 in | Wt 162.6 lb

## 2017-12-19 DIAGNOSIS — M797 Fibromyalgia: Secondary | ICD-10-CM | POA: Diagnosis not present

## 2017-12-19 DIAGNOSIS — F411 Generalized anxiety disorder: Secondary | ICD-10-CM

## 2017-12-19 DIAGNOSIS — M5136 Other intervertebral disc degeneration, lumbar region: Secondary | ICD-10-CM

## 2017-12-19 DIAGNOSIS — F909 Attention-deficit hyperactivity disorder, unspecified type: Secondary | ICD-10-CM

## 2017-12-19 DIAGNOSIS — F339 Major depressive disorder, recurrent, unspecified: Secondary | ICD-10-CM | POA: Diagnosis not present

## 2017-12-19 DIAGNOSIS — M51369 Other intervertebral disc degeneration, lumbar region without mention of lumbar back pain or lower extremity pain: Secondary | ICD-10-CM

## 2017-12-19 MED ORDER — AMPHETAMINE-DEXTROAMPHETAMINE 20 MG PO TABS: 20.0000 mg | ORAL_TABLET | Freq: Two times a day (BID) | ORAL | 0 refills | Status: DC

## 2017-12-19 MED ORDER — OXYCODONE HCL 20 MG PO TABS: 20.0000 mg | ORAL_TABLET | Freq: Four times a day (QID) | ORAL | 0 refills | Status: DC

## 2017-12-19 NOTE — Patient Instructions (Signed)
In a few days you may receive a survey in the mail or online from [employer] regarding your visit with us today. Please take a moment to fill this out. Your feedback is very important to our whole office. It can help us better understand your needs as well as improve your experience and satisfaction. Thank you for taking your time to complete it. We care about you.  Yasuko Lapage, PA-C  

## 2017-12-19 NOTE — Progress Notes (Signed)
BP 133/83   Pulse (!) 103   Temp (!) 96.7 F (35.9 C) (Oral)   Ht 5\' 7"  (1.702 m)   Wt 162 lb 9.6 oz (73.8 kg)   BMI 25.47 kg/m    Subjective:    Patient ID: Nancy Burnett, female    DOB: 11/21/1970, 47 y.o.   MRN: [id number]  HPI: Nancy CarinaMarsha A Lyford is a 47 y.o. female presenting on 12/19/2017 for Follow-up (4 week )  Patient comes in for follow-up on her ADHD and chronic pain related to fibromyalgia and disc disease.  Her does have been reviewed.  She does have severe disability related to these conditions.  She was ordered disability.  We will make adjustments on her medication as an needed.  Chronic pain is under control, patient has no new complaints. Medications are keeping things stable. Needs refills for the next three months.  [place] Controlled Substance website checked and normal. Drug screen performed  Patient will be contacting Dr. Vanetta ShawlHisada  in [place] for long term psychiatric care.  Past Medical History:  Diagnosis Date  . Anxiety   . Elevated WBCs   . Migraines    Relevant past medical, surgical, family and social history reviewed and updated as indicated. Interim medical history since our last visit reviewed. Allergies and medications reviewed and updated. DATA REVIEWED: CHART IN EPIC  Family History reviewed for pertinent findings.  Review of Systems  Constitutional: Negative.   HENT: Negative.   Eyes: Negative.   Respiratory: Negative.   Gastrointestinal: Negative.   Genitourinary: Negative.   Musculoskeletal: Positive for arthralgias, back pain and myalgias.  Neurological: Positive for headaches.  Psychiatric/Behavioral: Positive for decreased concentration and dysphoric mood. Negative for suicidal ideas. The patient is nervous/anxious.     Allergies as of 12/19/2017      Reactions   Cephalexin    "does not work"      Medication List        Accurate as of 12/19/17  9:41 AM. Always use your most recent med list.          ALPRAZolam 0.5 MG  tablet Commonly known as:  XANAX Take 1 tablet (0.5 mg total) by mouth 2 (two) times daily as needed for anxiety.   amphetamine-dextroamphetamine 20 MG tablet Commonly known as:  ADDERALL Take 1 tablet (20 mg total) by mouth 2 (two) times daily.   amphetamine-dextroamphetamine 20 MG tablet Commonly known as:  ADDERALL Take 1 tablet (20 mg total) by mouth 2 (two) times daily.   amphetamine-dextroamphetamine 20 MG tablet Commonly known as:  ADDERALL Take 1 tablet (20 mg total) by mouth 2 (two) times daily.   cyclobenzaprine 10 MG tablet Commonly known as:  FLEXERIL Take 1 tablet (10 mg total) 3 (three) times daily as needed by mouth for muscle spasms.   ibuprofen 600 MG tablet Commonly known as:  ADVIL,MOTRIN Take 1 tablet (600 mg total) by mouth every 6 (six) hours as needed.   LYRICA 150 MG capsule Generic drug:  pregabalin Take 150 mg 2 (two) times daily by mouth.   Oxycodone HCl 10 MG Tabs Take 1 tablet (10 mg total) by mouth 4 (four) times daily.   Oxycodone HCl 20 MG Tabs Take 1 tablet (20 mg total) by mouth 4 (four) times daily.   Oxycodone HCl 20 MG Tabs Take 1 tablet (20 mg total) by mouth 4 (four) times daily.   Oxycodone HCl 20 MG Tabs Take 1 tablet (20 mg total) by mouth 4 (four)  times daily.   risperiDONE 1 MG tablet Commonly known as:  RISPERDAL Take 0.5-1 tablets (0.5-1 mg total) by mouth at bedtime.   rizatriptan 10 MG disintegrating tablet Commonly known as:  MAXALT-MLT Take 1 tablet (10 mg total) by mouth as needed for migraine. May repeat in 2 hours if needed   SUMAtriptan 100 MG tablet Commonly known as:  IMITREX Take 1 tablet (100 mg total) by mouth every 2 (two) hours as needed for migraine.   venlafaxine XR 37.5 MG 24 hr capsule Commonly known as:  EFFEXOR XR Take 1 capsule (37.5 mg total) daily with breakfast by mouth.          Objective:    BP 133/83   Pulse (!) 103   Temp (!) 96.7 F (35.9 C) (Oral)   Ht 5\' 7"  (1.702 m)   Wt  162 lb 9.6 oz (73.8 kg)   BMI 25.47 kg/m   Allergies  Allergen Reactions  . Cephalexin     "does not work"    [employer] from Last 3 Encounters:  12/19/17 162 lb 9.6 oz (73.8 kg)  11/26/17 158 lb (71.7 kg)  08/29/17 166 lb (75.3 kg)    Physical Exam  Constitutional: She is oriented to person, place, and time. She appears well-developed and well-nourished.  HENT:  Head: Normocephalic and atraumatic.  Eyes: Conjunctivae and EOM are normal. Pupils are equal, round, and reactive to light.  Cardiovascular: Normal rate, regular rhythm, normal heart sounds and intact distal pulses.  Pulmonary/Chest: Effort normal and breath sounds normal.  Abdominal: Soft. Bowel sounds are normal.  Neurological: She is alert and oriented to person, place, and time. She has normal reflexes.  Skin: Skin is warm and dry. No rash noted.  Psychiatric: She has a normal mood and affect. Her behavior is normal. Judgment and thought content normal.        Assessment & Plan:   1. Fibromyalgia - [employer]ox[employer]SU[employer] Select 13 (MW), Urine  2. Depression, recurrent (HCC)  3. Attention deficit hyperactivity disorder (ADHD), unspecified ADHD type - ToxASSURE Select 13 (MW), Urine  4. GAD (generalized anxiety disorder)  5. DDD (degenerative disc disease), lumbar   Continue all other maintenance medications as listed above.  Follow up plan: Return in about 3 months (around 03/21/2018) for recheck.  Educational handout given for survey  Remus Loffler PA-C [facility] [address]  [place], [place] [address] [phone]   12/19/2017, 9:41 AM

## 2017-12-24 LAB — TOXASSURE SELECT 13 (MW), URINE

## 2018-01-09 ENCOUNTER — Telehealth: Payer: Self-pay | Admitting: Physician Assistant

## 2018-01-09 NOTE — Telephone Encounter (Signed)
Spoke with pharmacy and patient. Pharmacy aware that they can fill oxycodone 30 days from March 1st which should be March 30th.

## 2018-02-11 ENCOUNTER — Ambulatory Visit (INDEPENDENT_AMBULATORY_CARE_PROVIDER_SITE_OTHER): Payer: Medicare Other | Admitting: Family Medicine

## 2018-02-11 ENCOUNTER — Ambulatory Visit (INDEPENDENT_AMBULATORY_CARE_PROVIDER_SITE_OTHER): Payer: Medicare Other

## 2018-02-11 ENCOUNTER — Encounter: Payer: Self-pay | Admitting: Family Medicine

## 2018-02-11 VITALS — BP 142/96 | HR 108 | Temp 97.0°F | Ht 67.0 in | Wt 161.0 lb

## 2018-02-11 DIAGNOSIS — S6991XA Unspecified injury of right wrist, hand and finger(s), initial encounter: Secondary | ICD-10-CM | POA: Diagnosis not present

## 2018-02-11 DIAGNOSIS — M25531 Pain in right wrist: Secondary | ICD-10-CM

## 2018-02-11 MED ORDER — TRAMADOL HCL 50 MG PO TABS: 50.0000 mg | ORAL_TABLET | Freq: Four times a day (QID) | ORAL | 0 refills | Status: DC

## 2018-02-11 NOTE — Progress Notes (Signed)
Subjective:  Patient ID: Nancy Burnett, female    DOB: [birth year]  Age: 47 y.o. MRN: [id number]  CC: No chief complaint on file.   HPI Nancy Burnett presents for patient fell 5 days ago somewhat backwards and landed on outstretched hand.  She now has pain in the right wrist.  Pain is moderate it is over the dorsum of the hand at carpal metacarpal interfaces it is painful to extend the wrist.  She has no numbness weakness tingling of the wrist or hand.  Pain is moderately severe 6/10.  Depression screen [facility] 2/9 12/19/2017 11/26/2017 08/29/2017  Decreased Interest 2 0 2  Down, Depressed, Hopeless 2 0 1  PHQ - 2 Score 4 0 3  Altered sleeping 0 - 0  Tired, decreased energy 1 - 3  Change in appetite 1 - 2  Feeling bad or failure about yourself  0 - 0  Trouble concentrating 2 - 3  Moving slowly or fidgety/restless 0 - 0  Suicidal thoughts 0 - 0  PHQ-9 Score 8 - 11    History Nancy Burnett has a past medical history of Anxiety, Elevated WBCs, and Migraines.   She has no past surgical history on file.   Her family history includes Cancer in her sister; Heart failure in her father and mother; Hyperlipidemia in her father and mother; Hypertension in her father and mother.She reports that she has been smoking cigarettes.  She has a 15.00 pack-year smoking history. She has never used smokeless tobacco. She reports that she does not drink alcohol or use drugs.    ROS Review of Systems Noncontributory except for HPI Objective:  BP (!) 142/96   Pulse (!) 108   Temp (!) 97 F (36.1 C) (Oral)   Ht 5\' 7"  (1.702 m)   Wt 161 lb (73 kg)   BMI 25.22 kg/m   BP Readings from Last 3 Encounters:  02/11/18 (!) 142/96  12/19/17 133/83  11/26/17 135/85    Wt Readings from Last 3 Encounters:  02/11/18 161 lb (73 kg)  12/19/17 162 lb 9.6 oz (73.8 kg)  11/26/17 158 lb (71.7 kg)     Physical Exam  Constitutional: She is oriented to person, place, and time. No distress.  Cardiovascular: Normal rate  and regular rhythm.  Pulmonary/Chest: No respiratory distress. She has no wheezes.  Musculoskeletal: Normal range of motion.  There is moderate tenderness about the dorsum of the wrist and posterior hand.  There is full range of motion at the hand for the PIPs and DIPs.  MCPs are essentially normal as well she can extend and flex the major wrist joint but resistance to extension of the wrist yields moderately severe tenderness.  The left upper extremity is neurovascularly intact  Neurological: She is alert and oriented to person, place, and time.   Left wrist x-ray: Reviewed by radiologist showing a vague abnormality at the base of the ulnar articulation with the lunate.   Assessment & Plan:   Diagnoses and all orders for this visit:  Wrist pain, acute, right -     DG Wrist Complete Right; Future -     Ambulatory referral to Orthopedic Surgery  Other orders -     traMADol (ULTRAM) 50 MG tablet; Take 1 tablet (50 mg total) by mouth 4 (four) times daily.       I am having Nancy Burnett start on traMADol. I am also having her maintain her ibuprofen, SUMAtriptan, rizatriptan, LYRICA, venlafaxine XR, cyclobenzaprine, risperiDONE, Oxycodone  HCl, ALPRAZolam, amphetamine-dextroamphetamine, amphetamine-dextroamphetamine, amphetamine-dextroamphetamine, Oxycodone HCl, Oxycodone HCl, and Oxycodone HCl.  Allergies as of 02/11/2018      Reactions   Cephalexin    "does not work"      Medication List        Accurate as of 02/11/18 11:59 PM. Always use your most recent med list.          ALPRAZolam 0.5 MG tablet Commonly known as:  XANAX Take 1 tablet (0.5 mg total) by mouth 2 (two) times daily as needed for anxiety.   amphetamine-dextroamphetamine 20 MG tablet Commonly known as:  ADDERALL Take 1 tablet (20 mg total) by mouth 2 (two) times daily.   amphetamine-dextroamphetamine 20 MG tablet Commonly known as:  ADDERALL Take 1 tablet (20 mg total) by mouth 2 (two) times daily.     amphetamine-dextroamphetamine 20 MG tablet Commonly known as:  ADDERALL Take 1 tablet (20 mg total) by mouth 2 (two) times daily.   cyclobenzaprine 10 MG tablet Commonly known as:  FLEXERIL Take 1 tablet (10 mg total) 3 (three) times daily as needed by mouth for muscle spasms.   ibuprofen 600 MG tablet Commonly known as:  ADVIL,MOTRIN Take 1 tablet (600 mg total) by mouth every 6 (six) hours as needed.   LYRICA 150 MG capsule Generic drug:  pregabalin Take 150 mg 2 (two) times daily by mouth.   Oxycodone HCl 10 MG Tabs Take 1 tablet (10 mg total) by mouth 4 (four) times daily.   Oxycodone HCl 20 MG Tabs Take 1 tablet (20 mg total) by mouth 4 (four) times daily.   Oxycodone HCl 20 MG Tabs Take 1 tablet (20 mg total) by mouth 4 (four) times daily.   Oxycodone HCl 20 MG Tabs Take 1 tablet (20 mg total) by mouth 4 (four) times daily.   risperiDONE 1 MG tablet Commonly known as:  RISPERDAL Take 0.5-1 tablets (0.5-1 mg total) by mouth at bedtime.   rizatriptan 10 MG disintegrating tablet Commonly known as:  MAXALT-MLT Take 1 tablet (10 mg total) by mouth as needed for migraine. May repeat in 2 hours if needed   SUMAtriptan 100 MG tablet Commonly known as:  IMITREX Take 1 tablet (100 mg total) by mouth every 2 (two) hours as needed for migraine.   traMADol 50 MG tablet Commonly known as:  ULTRAM Take 1 tablet (50 mg total) by mouth 4 (four) times daily.   venlafaxine XR 37.5 MG 24 hr capsule Commonly known as:  EFFEXOR XR Take 1 capsule (37.5 mg total) daily with breakfast by mouth.      Wrist brace dispensed.  Follow-up: Return if symptoms worsen or fail to improve.  Mechele ClaudeWarren Lyanne Kates, M.D.

## 2018-02-16 ENCOUNTER — Encounter: Payer: Self-pay | Admitting: Family Medicine

## 2018-03-10 ENCOUNTER — Encounter: Payer: Self-pay | Admitting: Physician Assistant

## 2018-03-10 ENCOUNTER — Ambulatory Visit (INDEPENDENT_AMBULATORY_CARE_PROVIDER_SITE_OTHER): Payer: Medicare Other | Admitting: Physician Assistant

## 2018-03-10 VITALS — BP 167/89 | HR 89 | Ht 67.0 in | Wt 162.6 lb

## 2018-03-10 DIAGNOSIS — G43809 Other migraine, not intractable, without status migrainosus: Secondary | ICD-10-CM

## 2018-03-10 DIAGNOSIS — R030 Elevated blood-pressure reading, without diagnosis of hypertension: Secondary | ICD-10-CM | POA: Diagnosis not present

## 2018-03-10 DIAGNOSIS — F909 Attention-deficit hyperactivity disorder, unspecified type: Secondary | ICD-10-CM

## 2018-03-10 DIAGNOSIS — M797 Fibromyalgia: Secondary | ICD-10-CM

## 2018-03-10 MED ORDER — AMPHETAMINE-DEXTROAMPHETAMINE 20 MG PO TABS: 20.0000 mg | ORAL_TABLET | Freq: Two times a day (BID) | ORAL | 0 refills | Status: DC

## 2018-03-10 MED ORDER — LYRICA 150 MG PO CAPS: 150.0000 mg | ORAL_CAPSULE | Freq: Three times a day (TID) | ORAL | 5 refills | Status: DC

## 2018-03-10 MED ORDER — OXYCODONE HCL 20 MG PO TABS: 20.0000 mg | ORAL_TABLET | Freq: Four times a day (QID) | ORAL | 0 refills | Status: DC

## 2018-03-10 MED ORDER — AMPHETAMINE-DEXTROAMPHETAMINE 20 MG PO TABS
20.0000 mg | ORAL_TABLET | Freq: Two times a day (BID) | ORAL | 0 refills | Status: DC
Start: 2018-03-10 — End: 2018-03-11

## 2018-03-10 MED ORDER — LISINOPRIL 5 MG PO TABS: 5.0000 mg | ORAL_TABLET | Freq: Every day | ORAL | 3 refills | Status: DC

## 2018-03-10 NOTE — Patient Instructions (Signed)
[facility] [phone]   Your provider wants you to schedule an appointment with a Psychologist/Psychiatrist. The following list of offices requires the patient to call and make their own appointment, as there is information they need that only you can provide. Please feel free to choose form the following providers:  [facility]   [phone] Crisis Recovery in [place] [phone]  [facility]  [phone] [place], [place]  (Scheduled through [facility]) Must call and do an interview for appointment. Sees Children / Accepts Medicaid  [facility]    [phone]  [address]    [place], [place]       [facility]  [phone] [address] [place], [place]  Evaluates for Autism but does not treat it Sees Children / Accepts Medicaid  [facility]    [phone] [address]   [place], [place] Medication management, substance abuse, bipolar, grief, family, marriage, OCD, anxiety, PTSD Sees children / Accepts Medicaid  [facility]    [phone] [address] [place], [place] Sees children / Accepts [facility]  [facility]  [phone] [address] [place], [place]   Dr Estelle Grumbles     [phone] [address] [place], [place]  Sees ADD & ADHD for treatment Accepts Medicaid  [facility]  [phone] [place], [place] Evaluates for Autism Accepts [facility]  [facility]  [phone] [address] [place], [place]  Does Adult ADD evaluations Does not accept Medicaid  [facility]   [phone] [address] [place], [place] Uses animal therapy  Sees children as young as 51 years old Accepts [facility]     [phone]    [address]  [place], [place] [address] Sees children Accepts Medicaid

## 2018-03-11 ENCOUNTER — Telehealth: Payer: Self-pay | Admitting: Physician Assistant

## 2018-03-11 ENCOUNTER — Other Ambulatory Visit: Payer: Self-pay | Admitting: Physician Assistant

## 2018-03-11 DIAGNOSIS — F909 Attention-deficit hyperactivity disorder, unspecified type: Secondary | ICD-10-CM

## 2018-03-11 DIAGNOSIS — F411 Generalized anxiety disorder: Secondary | ICD-10-CM

## 2018-03-11 DIAGNOSIS — F339 Major depressive disorder, recurrent, unspecified: Secondary | ICD-10-CM

## 2018-03-11 DIAGNOSIS — M797 Fibromyalgia: Secondary | ICD-10-CM

## 2018-03-11 DIAGNOSIS — G43809 Other migraine, not intractable, without status migrainosus: Secondary | ICD-10-CM

## 2018-03-11 DIAGNOSIS — R030 Elevated blood-pressure reading, without diagnosis of hypertension: Secondary | ICD-10-CM

## 2018-03-11 MED ORDER — OXYCODONE HCL 20 MG PO TABS: 20.0000 mg | ORAL_TABLET | Freq: Four times a day (QID) | ORAL | 0 refills | Status: DC

## 2018-03-11 MED ORDER — SUMATRIPTAN SUCCINATE 100 MG PO TABS: 100.0000 mg | ORAL_TABLET | ORAL | 5 refills | Status: DC | PRN

## 2018-03-11 MED ORDER — RIZATRIPTAN BENZOATE 10 MG PO TBDP: 10.0000 mg | ORAL_TABLET | ORAL | 11 refills | Status: DC | PRN

## 2018-03-11 MED ORDER — LYRICA 150 MG PO CAPS: 150.0000 mg | ORAL_CAPSULE | Freq: Three times a day (TID) | ORAL | 5 refills | Status: DC

## 2018-03-11 MED ORDER — RISPERIDONE 1 MG PO TABS: 0.5000 mg | ORAL_TABLET | Freq: Every day | ORAL | 5 refills | Status: DC

## 2018-03-11 MED ORDER — AMPHETAMINE-DEXTROAMPHETAMINE 20 MG PO TABS: 20.0000 mg | ORAL_TABLET | Freq: Two times a day (BID) | ORAL | 0 refills | Status: DC

## 2018-03-11 MED ORDER — CYCLOBENZAPRINE HCL 10 MG PO TABS: 10.0000 mg | ORAL_TABLET | Freq: Three times a day (TID) | ORAL | 0 refills | Status: DC | PRN

## 2018-03-11 MED ORDER — IBUPROFEN 600 MG PO TABS: 600.0000 mg | ORAL_TABLET | Freq: Four times a day (QID) | ORAL | 0 refills | Status: AC | PRN

## 2018-03-11 MED ORDER — VENLAFAXINE HCL ER 37.5 MG PO CP24: 37.5000 mg | ORAL_CAPSULE | Freq: Every day | ORAL | 2 refills | Status: DC

## 2018-03-11 MED ORDER — LISINOPRIL 5 MG PO TABS: 5.0000 mg | ORAL_TABLET | Freq: Every day | ORAL | 3 refills | Status: DC

## 2018-03-11 MED ORDER — ALPRAZOLAM 0.5 MG PO TABS: 0.5000 mg | ORAL_TABLET | Freq: Two times a day (BID) | ORAL | 5 refills | Status: DC | PRN
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
## 2018-03-11 NOTE — Telephone Encounter (Signed)
Left voice mail.  Scripts were sent to new pharmacy.

## 2018-03-11 NOTE — Telephone Encounter (Signed)
Please advise- some Rx's are controlled.

## 2018-03-11 NOTE — Telephone Encounter (Signed)
Refills sent

## 2018-03-18 ENCOUNTER — Ambulatory Visit: Payer: Medicare Other | Admitting: Physician Assistant

## 2018-05-11 ENCOUNTER — Telehealth: Payer: Self-pay | Admitting: Physician Assistant

## 2018-05-20 ENCOUNTER — Ambulatory Visit: Payer: Medicare Other | Admitting: Physician Assistant

## 2018-05-26 ENCOUNTER — Telehealth: Payer: Self-pay | Admitting: Physician Assistant

## 2018-05-26 ENCOUNTER — Encounter: Payer: Self-pay | Admitting: Physician Assistant

## 2018-05-26 ENCOUNTER — Ambulatory Visit (INDEPENDENT_AMBULATORY_CARE_PROVIDER_SITE_OTHER): Payer: Medicare Other | Admitting: Physician Assistant

## 2018-05-26 ENCOUNTER — Other Ambulatory Visit: Payer: Self-pay | Admitting: Physician Assistant

## 2018-05-26 VITALS — BP 116/72 | HR 105 | Ht 67.0 in | Wt 163.4 lb

## 2018-05-26 DIAGNOSIS — G8929 Other chronic pain: Secondary | ICD-10-CM

## 2018-05-26 DIAGNOSIS — F909 Attention-deficit hyperactivity disorder, unspecified type: Secondary | ICD-10-CM

## 2018-05-26 DIAGNOSIS — M25562 Pain in left knee: Secondary | ICD-10-CM

## 2018-05-26 DIAGNOSIS — G43809 Other migraine, not intractable, without status migrainosus: Secondary | ICD-10-CM | POA: Diagnosis not present

## 2018-05-26 DIAGNOSIS — Z1211 Encounter for screening for malignant neoplasm of colon: Secondary | ICD-10-CM

## 2018-05-26 DIAGNOSIS — M797 Fibromyalgia: Secondary | ICD-10-CM

## 2018-05-26 DIAGNOSIS — F339 Major depressive disorder, recurrent, unspecified: Secondary | ICD-10-CM

## 2018-05-26 DIAGNOSIS — R030 Elevated blood-pressure reading, without diagnosis of hypertension: Secondary | ICD-10-CM

## 2018-05-26 MED ORDER — AMPHETAMINE-DEXTROAMPHETAMINE 20 MG PO TABS: 20.0000 mg | ORAL_TABLET | Freq: Two times a day (BID) | ORAL | 0 refills | Status: DC

## 2018-05-26 MED ORDER — OXYCODONE HCL 20 MG PO TABS
20.0000 mg | ORAL_TABLET | ORAL | 0 refills | Status: DC
Start: 2018-05-26 — End: 2018-08-24

## 2018-05-26 MED ORDER — OXYCODONE HCL 20 MG PO TABS: 20.0000 mg | ORAL_TABLET | ORAL | 0 refills | Status: DC

## 2018-05-26 MED ORDER — NICOTINE 21-14-7 MG/24HR TD KIT: 1.0000 | PACK | Freq: Every day | TRANSDERMAL | 2 refills | Status: DC

## 2018-05-26 MED ORDER — RISPERIDONE 1 MG PO TABS: 0.5000 mg | ORAL_TABLET | Freq: Every day | ORAL | 5 refills | Status: DC

## 2018-05-26 MED ORDER — SUMATRIPTAN SUCCINATE 100 MG PO TABS: 100.0000 mg | ORAL_TABLET | ORAL | 5 refills | Status: DC | PRN

## 2018-05-26 MED ORDER — LYRICA 150 MG PO CAPS: 150.0000 mg | ORAL_CAPSULE | Freq: Three times a day (TID) | ORAL | 5 refills | Status: DC

## 2018-05-26 MED ORDER — ALBUTEROL SULFATE HFA 108 (90 BASE) MCG/ACT IN AERS: 2.0000 | INHALATION_SPRAY | Freq: Four times a day (QID) | RESPIRATORY_TRACT | 2 refills | Status: DC | PRN

## 2018-05-26 MED ORDER — LISINOPRIL 5 MG PO TABS: 5.0000 mg | ORAL_TABLET | Freq: Every day | ORAL | 3 refills | Status: DC

## 2018-05-26 MED ORDER — RIZATRIPTAN BENZOATE 10 MG PO TBDP
10.0000 mg | ORAL_TABLET | ORAL | 11 refills | Status: DC | PRN
Start: 2018-05-26 — End: 2023-10-09

## 2018-05-26 MED ORDER — LORATADINE 10 MG PO TABS: 10.0000 mg | ORAL_TABLET | Freq: Every day | ORAL | 11 refills | Status: DC

## 2018-05-26 NOTE — Telephone Encounter (Signed)
This needs to be filled on 05/28/18 when it should be done.  Our policy is that we do not filled medications early.

## 2018-05-26 NOTE — Progress Notes (Signed)
BP 116/72   Pulse (!) 105   Ht _0  (1.702 m)   Wt 163 lb 6.4 oz (74.1 kg)   BMI 25.59 kg/m    Subjective:    Patient ID: Nancy Burnett, female    DOB: Jan 14, 1971, 47 y.o.   MRN: [id number]  HPI: Nancy Burnett is a 47 y.o. female presenting on 05/26/2018 for ADHD (3 month ); Fibromyalgia; and Knee Pain Patient comes in for chronic 27-month3 Check on multiple joint pain.  Her left knee has been particularly painful in recent weeks.  She had a significant amount of swelling above the kneecap and then it moved to the popliteal space.  She does have popping and cracking when she moves and she becomes very stiff after sitting still for a while. Will refer to orthopedics.  Also needs refills on her ADHD medications, which will be sent to her new pharmacy. Chronic pain is under control, patient has no new complaints. Medications are keeping things stable. Needs refills for the next three months.  [place] Controlled Substance website checked and normal. Drug screen normal this year.  Past Medical History:  Diagnosis Date  . Anxiety   . Elevated WBCs   . Migraines    Relevant past medical, surgical, family and social history reviewed and updated as indicated. Interim medical history since our last visit reviewed. Allergies and medications reviewed and updated. DATA REVIEWED: CHART IN EPIC  Family History reviewed for pertinent findings.  Review of Systems  Constitutional: Negative.   HENT: Negative.   Eyes: Negative.   Respiratory: Negative.   Gastrointestinal: Negative.   Genitourinary: Negative.   Musculoskeletal: Positive for arthralgias, joint swelling and myalgias.  Psychiatric/Behavioral: Negative for agitation. The patient is hyperactive.     Allergies as of 05/26/2018      Reactions   Cephalexin    "does not work"      Medication List        Accurate as of 05/26/18  1:44 PM. Always use your most recent med list.          albuterol 108 (90 Base) MCG/ACT  inhaler Commonly known as:  PROVENTIL HFA;VENTOLIN HFA Inhale 2 puffs into the lungs every 6 (six) hours as needed for wheezing or shortness of breath.   ALPRAZolam 0.5 MG tablet Commonly known as:  XANAX Take 1 tablet (0.5 mg total) by mouth 2 (two) times daily as needed for anxiety.   amphetamine-dextroamphetamine 20 MG tablet Commonly known as:  ADDERALL Take 1 tablet (20 mg total) by mouth 2 (two) times daily.   amphetamine-dextroamphetamine 20 MG tablet Commonly known as:  ADDERALL Take 1 tablet (20 mg total) by mouth 2 (two) times daily.   amphetamine-dextroamphetamine 20 MG tablet Commonly known as:  ADDERALL Take 1 tablet (20 mg total) by mouth 2 (two) times daily.   ibuprofen 600 MG tablet Commonly known as:  ADVIL,MOTRIN Take 1 tablet (600 mg total) by mouth every 6 (six) hours as needed.   lisinopril 5 MG tablet Commonly known as:  PRINIVIL,ZESTRIL Take 1 tablet (5 mg total) by mouth daily.   loratadine 10 MG tablet Commonly known as:  CLARITIN Take 1 tablet (10 mg total) by mouth daily.   LYRICA 150 MG capsule Generic drug:  pregabalin Take 1 capsule (150 mg total) by mouth 3 (three) times daily.   Nicotine 21-14-7 MG/24HR Kit Place 1 patch onto the skin daily.   Oxycodone HCl 20 MG Tabs Take 1 tablet (20 mg  total) by mouth every 4 (four) hours.   Oxycodone HCl 20 MG Tabs Take 1 tablet (20 mg total) by mouth every 4 (four) hours.   Oxycodone HCl 20 MG Tabs Take 1 tablet (20 mg total) by mouth every 4 (four) hours.   risperiDONE 1 MG tablet Commonly known as:  RISPERDAL Take 0.5-1 tablets (0.5-1 mg total) by mouth at bedtime.   rizatriptan 10 MG disintegrating tablet Commonly known as:  MAXALT-MLT Take 1 tablet (10 mg total) by mouth as needed for migraine. May repeat in 2 hours if needed   SUMAtriptan 100 MG tablet Commonly known as:  IMITREX Take 1 tablet (100 mg total) by mouth every 2 (two) hours as needed for migraine.   traMADol 50 MG  tablet Commonly known as:  ULTRAM Take 1 tablet (50 mg total) by mouth 4 (four) times daily.   venlafaxine XR 37.5 MG 24 hr capsule Commonly known as:  EFFEXOR XR Take 1 capsule (37.5 mg total) by mouth daily with breakfast.          Objective:    BP 116/72   Pulse (!) 105   Ht _0  (1.702 m)   Wt 163 lb 6.4 oz (74.1 kg)   BMI 25.59 kg/m   Allergies  Allergen Reactions  . Cephalexin     "does not work"    [employer] from Last 3 Encounters:  05/26/18 163 lb 6.4 oz (74.1 kg)  03/10/18 162 lb 9.6 oz (73.8 kg)  02/11/18 161 lb (73 kg)    Physical Exam  Constitutional: She is oriented to person, place, and time. She appears well-developed and well-nourished.  HENT:  Head: Normocephalic and atraumatic.  Eyes: Pupils are equal, round, and reactive to light. Conjunctivae and EOM are normal.  Cardiovascular: Normal rate, regular rhythm, normal heart sounds and intact distal pulses.  Pulmonary/Chest: Effort normal and breath sounds normal.  Abdominal: Soft. Bowel sounds are normal.  Musculoskeletal:       Left knee: She exhibits swelling. She exhibits no laceration and no bony tenderness. Tenderness found.       Lumbar back: She exhibits decreased range of motion, pain and spasm.       Back:       Legs: Crepitus on exam  Neurological: She is alert and oriented to person, place, and time. She has normal reflexes.  Skin: Skin is warm and dry. No rash noted.  Psychiatric: She has a normal mood and affect. Her behavior is normal. Judgment and thought content normal.    Results for orders placed or performed in visit on 12/19/17  [employer]oxASSURE Select 13 (MW), Urine  Result Value Ref Range   Summary FINAL       Assessment & Plan:   1. Attention deficit hyperactivity disorder (ADHD), unspecified ADHD type - amphetamine-dextroamphetamine (ADDERALL) 20 MG tablet; Take 1 tablet (20 mg total) by mouth 2 (two) times daily.  Dispense: 60 tablet; Refill: 0 -  amphetamine-dextroamphetamine (ADDERALL) 20 MG tablet; Take 1 tablet (20 mg total) by mouth 2 (two) times daily.  Dispense: 60 tablet; Refill: 0 - amphetamine-dextroamphetamine (ADDERALL) 20 MG tablet; Take 1 tablet (20 mg total) by mouth 2 (two) times daily.  Dispense: 60 tablet; Refill: 0  2. Fibromyalgia - Oxycodone HCl 20 MG TABS; Take 1 tablet (20 mg total) by mouth every 4 (four) hours.  Dispense: 150 tablet; Refill: 0 - Oxycodone HCl 20 MG TABS; Take 1 tablet (20 mg total) by mouth every 4 (four) hours.  Dispense: 150 tablet; Refill: 0 - Oxycodone HCl 20 MG TABS; Take 1 tablet (20 mg total) by mouth every 4 (four) hours.  Dispense: 150 tablet; Refill: 0 - LYRICA 150 MG capsule; Take 1 capsule (150 mg total) by mouth 3 (three) times daily.  Dispense: 90 capsule; Refill: 5  3. Depression, recurrent ([place]) - risperiDONE (RISPERDAL) 1 MG tablet; Take 0.5-1 tablets (0.5-1 mg total) by mouth at bedtime.  Dispense: 30 tablet; Refill: 5  4. Other migraine without status migrainosus, not intractable - rizatriptan (MAXALT-MLT) 10 MG disintegrating tablet; Take 1 tablet (10 mg total) by mouth as needed for migraine. May repeat in 2 hours if needed  Dispense: 10 tablet; Refill: 11 - SUMAtriptan (IMITREX) 100 MG tablet; Take 1 tablet (100 mg total) by mouth every 2 (two) hours as needed for migraine.  Dispense: 10 tablet; Refill: 5  5. Elevated BP without diagnosis of hypertension - lisinopril (PRINIVIL,ZESTRIL) 5 MG tablet; Take 1 tablet (5 mg total) by mouth daily.  Dispense: 90 tablet; Refill: 3  6. Chronic pain of left knee - Ambulatory referral to Orthopedic Surgery   Continue all other maintenance medications as listed above.  Follow up plan: Return in about 3 months (around 08/26/2018) for recheck.  Educational handout given for [place] PA-C [facility] [address]  [place],  [address] [phone]   05/26/2018, 1:44 PM

## 2018-05-26 NOTE — Telephone Encounter (Signed)
Pharmacy aware

## 2018-05-26 NOTE — Telephone Encounter (Signed)
Returned pharmacy call - [facility]. States that patient wanted to get her oxy 20mg  filled early- states that she will pay cash price if pharmacy will not fill it. Oxy was last fill July 19 #120 per pharmacy that means patient has been taking 6-7 pills daily since she has already ran out.  Advised patient that they could not fill it until 8/8 which is still 4 days early.  Patient got [facility] to call and they told pharmacy that they will be calling the doctors office to have it approved for patient to get filled early.  FYI

## 2018-05-28 ENCOUNTER — Encounter: Payer: Self-pay | Admitting: Gastroenterology

## 2018-06-10 ENCOUNTER — Ambulatory Visit: Payer: Medicare Other | Admitting: Physician Assistant

## 2018-06-11 ENCOUNTER — Telehealth: Payer: Self-pay | Admitting: Physician Assistant

## 2018-06-11 ENCOUNTER — Other Ambulatory Visit: Payer: Self-pay | Admitting: Physician Assistant

## 2018-06-11 DIAGNOSIS — M797 Fibromyalgia: Secondary | ICD-10-CM

## 2018-06-11 MED ORDER — PREGABALIN 150 MG PO CAPS: 150.0000 mg | ORAL_CAPSULE | Freq: Three times a day (TID) | ORAL | 5 refills | Status: DC

## 2018-06-11 NOTE — Telephone Encounter (Signed)
sent 

## 2018-06-11 NOTE — Telephone Encounter (Signed)
Please advise 

## 2018-06-24 ENCOUNTER — Other Ambulatory Visit: Payer: Self-pay | Admitting: Physician Assistant

## 2018-07-09 ENCOUNTER — Other Ambulatory Visit: Payer: Self-pay | Admitting: *Deleted

## 2018-07-09 DIAGNOSIS — F339 Major depressive disorder, recurrent, unspecified: Secondary | ICD-10-CM

## 2018-07-09 MED ORDER — RISPERIDONE 1 MG PO TABS: 0.5000 mg | ORAL_TABLET | Freq: Every day | ORAL | 0 refills | Status: DC

## 2018-08-24 ENCOUNTER — Ambulatory Visit (INDEPENDENT_AMBULATORY_CARE_PROVIDER_SITE_OTHER): Payer: Medicare Other | Admitting: Physician Assistant

## 2018-08-24 ENCOUNTER — Encounter: Payer: Self-pay | Admitting: Physician Assistant

## 2018-08-24 DIAGNOSIS — F909 Attention-deficit hyperactivity disorder, unspecified type: Secondary | ICD-10-CM | POA: Diagnosis not present

## 2018-08-24 DIAGNOSIS — F411 Generalized anxiety disorder: Secondary | ICD-10-CM

## 2018-08-24 DIAGNOSIS — G43809 Other migraine, not intractable, without status migrainosus: Secondary | ICD-10-CM

## 2018-08-24 DIAGNOSIS — Z23 Encounter for immunization: Secondary | ICD-10-CM

## 2018-08-24 DIAGNOSIS — M797 Fibromyalgia: Secondary | ICD-10-CM | POA: Diagnosis not present

## 2018-08-24 DIAGNOSIS — F339 Major depressive disorder, recurrent, unspecified: Secondary | ICD-10-CM

## 2018-08-24 MED ORDER — OXYCODONE HCL 20 MG PO TABS: 20.0000 mg | ORAL_TABLET | ORAL | 0 refills | Status: DC

## 2018-08-24 MED ORDER — ALPRAZOLAM 0.5 MG PO TABS: 0.5000 mg | ORAL_TABLET | Freq: Two times a day (BID) | ORAL | 5 refills | Status: DC | PRN

## 2018-08-24 MED ORDER — AMPHETAMINE-DEXTROAMPHETAMINE 20 MG PO TABS: 20.0000 mg | ORAL_TABLET | Freq: Two times a day (BID) | ORAL | 0 refills | Status: DC

## 2018-08-24 MED ORDER — RISPERIDONE 2 MG PO TABS: 2.0000 mg | ORAL_TABLET | Freq: Every day | ORAL | 3 refills | Status: DC

## 2018-08-24 MED ORDER — VENLAFAXINE HCL ER 150 MG PO CP24: 150.0000 mg | ORAL_CAPSULE | Freq: Every day | ORAL | 3 refills | Status: AC

## 2018-08-24 NOTE — Patient Instructions (Signed)
In a few days you may receive a survey in the mail or online from [employer] regarding your visit with us today. Please take a moment to fill this out. Your feedback is very important to our whole office. It can help us better understand your needs as well as improve your experience and satisfaction. Thank you for taking your time to complete it. We care about you.  Yuriel Lopezmartinez, PA-C  

## 2018-08-25 NOTE — Progress Notes (Signed)
BP 135/88   Pulse (!) 104   Temp 98.9 F (37.2 C) (Oral)   Ht '5\' 7"'  (1.702 m)   Wt 164 lb (74.4 kg)   BMI 25.69 kg/m    Subjective:    Patient ID: Nancy Burnett, female    DOB: 12/01/1970, 47 y.o.   MRN: [id number]  HPI: Nancy Burnett is a 47 y.o. female presenting on 08/24/2018 for Depression (3 month ) and Fibromyalgia   All medications are reviewed today. There are no reports of any problems with the medications. All of the medical conditions are reviewed and updated.  Lab work is reviewed and will be ordered as medically necessary. There are no new problems reported with today's visit.  This patient comes in for 70-monthfollow-up on her chronic medical conditions.  They do include depression, fibromyalgia, migraine, ADHD, anxiety.  All of her medications seem to be stable.  She is not having any other complaints.  A new contract has been updated.  There are no concerns in the registry.  She will follow-up on an every three-month basis for recheck on her medications. Past Medical History:  Diagnosis Date  . Anxiety   . Elevated WBCs   . Migraines    Relevant past medical, surgical, family and social history reviewed and updated as indicated. Interim medical history since our last visit reviewed. Allergies and medications reviewed and updated. DATA REVIEWED: CHART IN EPIC  Family History reviewed for pertinent findings.  Review of Systems  Constitutional: Negative.   HENT: Negative.   Eyes: Negative.   Respiratory: Negative.   Gastrointestinal: Negative.   Genitourinary: Negative.   Musculoskeletal: Positive for arthralgias, back pain and myalgias.  Neurological: Positive for headaches.  Psychiatric/Behavioral: Positive for decreased concentration and dysphoric mood. The patient is nervous/anxious.     Allergies as of 08/24/2018      Reactions   Cephalexin    "does not work"      Medication List        Accurate as of 08/24/18 11:59 PM. Always use your most  recent med list.          ALPRAZolam 0.5 MG tablet Commonly known as:  XANAX Take 1 tablet (0.5 mg total) by mouth 2 (two) times daily as needed for anxiety.   amphetamine-dextroamphetamine 20 MG tablet Commonly known as:  ADDERALL Take 1 tablet (20 mg total) by mouth 2 (two) times daily.   amphetamine-dextroamphetamine 20 MG tablet Commonly known as:  ADDERALL Take 1 tablet (20 mg total) by mouth 2 (two) times daily.   amphetamine-dextroamphetamine 20 MG tablet Commonly known as:  ADDERALL Take 1 tablet (20 mg total) by mouth 2 (two) times daily.   ibuprofen 600 MG tablet Commonly known as:  ADVIL,MOTRIN Take 1 tablet (600 mg total) by mouth every 6 (six) hours as needed.   lisinopril 5 MG tablet Commonly known as:  PRINIVIL,ZESTRIL Take 1 tablet (5 mg total) by mouth daily.   loratadine 10 MG tablet Commonly known as:  CLARITIN Take 1 tablet (10 mg total) by mouth daily.   Nicotine 21-14-7 MG/24HR Kit Place 1 patch onto the skin daily.   Oxycodone HCl 20 MG Tabs Take 1 tablet (20 mg total) by mouth every 4 (four) hours.   Oxycodone HCl 20 MG Tabs Take 1 tablet (20 mg total) by mouth every 4 (four) hours.   Oxycodone HCl 20 MG Tabs Take 1 tablet (20 mg total) by mouth every 4 (four) hours.  pregabalin 150 MG capsule Commonly known as:  LYRICA Take 1 capsule (150 mg total) by mouth 3 (three) times daily.   PROAIR HFA 108 (90 Base) MCG/ACT inhaler Generic drug:  albuterol Inhale 2 puffs into the lungs every 6 (six) hours as needed for wheezing or shortness of breath.   risperiDONE 2 MG tablet Commonly known as:  RISPERDAL Take 1 tablet (2 mg total) by mouth at bedtime.   rizatriptan 10 MG disintegrating tablet Commonly known as:  MAXALT-MLT Take 1 tablet (10 mg total) by mouth as needed for migraine. May repeat in 2 hours if needed   SUMAtriptan 100 MG tablet Commonly known as:  IMITREX Take 1 tablet (100 mg total) by mouth every 2 (two) hours as needed  for migraine.   traMADol 50 MG tablet Commonly known as:  ULTRAM Take 1 tablet (50 mg total) by mouth 4 (four) times daily.   venlafaxine XR 150 MG 24 hr capsule Commonly known as:  EFFEXOR-XR Take 1 capsule (150 mg total) by mouth daily with breakfast.          Objective:    BP 135/88   Pulse (!) 104   Temp 98.9 F (37.2 C) (Oral)   Ht '5\' 7"'  (1.702 m)   Wt 164 lb (74.4 kg)   BMI 25.69 kg/m   Allergies  Allergen Reactions  . Cephalexin     "does not work"    [employer] from Last 3 Encounters:  08/24/18 164 lb (74.4 kg)  05/26/18 163 lb 6.4 oz (74.1 kg)  03/10/18 162 lb 9.6 oz (73.8 kg)    Physical Exam  Constitutional: She is oriented to person, place, and time. She appears well-developed and well-nourished.  HENT:  Head: Normocephalic and atraumatic.  Eyes: Pupils are equal, round, and reactive to light. Conjunctivae and EOM are normal.  Cardiovascular: Normal rate, regular rhythm, normal heart sounds and intact distal pulses.  Pulmonary/Chest: Effort normal and breath sounds normal.  Abdominal: Soft. Bowel sounds are normal.  Neurological: She is alert and oriented to person, place, and time. She has normal reflexes.  Skin: Skin is warm and dry. No rash noted.  Psychiatric: She has a normal mood and affect. Her behavior is normal. Judgment and thought content normal.    Results for orders placed or performed in visit on 12/19/17  [employer] Select 13 (MW), Urine  Result Value Ref Range   Summary FINAL       Assessment & Plan:   1. Attention deficit hyperactivity disorder (ADHD), unspecified ADHD type - amphetamine-dextroamphetamine (ADDERALL) 20 MG tablet; Take 1 tablet (20 mg total) by mouth 2 (two) times daily.  Dispense: 60 tablet; Refill: 0 - amphetamine-dextroamphetamine (ADDERALL) 20 MG tablet; Take 1 tablet (20 mg total) by mouth 2 (two) times daily.  Dispense: 60 tablet; Refill: 0 - amphetamine-dextroamphetamine (ADDERALL) 20 MG tablet; Take 1  tablet (20 mg total) by mouth 2 (two) times daily.  Dispense: 60 tablet; Refill: 0  2. Fibromyalgia - Oxycodone HCl 20 MG TABS; Take 1 tablet (20 mg total) by mouth every 4 (four) hours.  Dispense: 150 tablet; Refill: 0 - Oxycodone HCl 20 MG TABS; Take 1 tablet (20 mg total) by mouth every 4 (four) hours.  Dispense: 150 tablet; Refill: 0 - Oxycodone HCl 20 MG TABS; Take 1 tablet (20 mg total) by mouth every 4 (four) hours.  Dispense: 150 tablet; Refill: 0  3. GAD (generalized anxiety disorder) - ALPRAZolam (XANAX) 0.5 MG tablet; Take 1 tablet (0.5  mg total) by mouth 2 (two) times daily as needed for anxiety.  Dispense: 60 tablet; Refill: 5  4. Other migraine without status migrainosus, not intractable  5. Depression, recurrent ([place]) - risperiDONE (RISPERDAL) 2 MG tablet; Take 1 tablet (2 mg total) by mouth at bedtime.  Dispense: 90 tablet; Refill: 3 - venlafaxine XR (EFFEXOR-XR) 150 MG 24 hr capsule; Take 1 capsule (150 mg total) by mouth daily with breakfast.  Dispense: 90 capsule; Refill: 3  6. Need for immunization against influenza - Flu Vaccine QUAD 36+ mos IM   Continue all other maintenance medications as listed above.  Follow up plan: Return in about 3 months (around 11/24/2018) for recheck.  Educational handout given for [place] PA-C [facility] [address]  [place], [place] [address] [phone]   08/25/2018, 10:20 PM

## 2018-09-01 ENCOUNTER — Ambulatory Visit: Payer: BLUE CROSS/BLUE SHIELD | Admitting: Gastroenterology

## 2018-09-01 ENCOUNTER — Encounter: Payer: Self-pay | Admitting: Gastroenterology

## 2018-09-01 ENCOUNTER — Telehealth: Payer: Self-pay | Admitting: Gastroenterology

## 2018-09-01 NOTE — Telephone Encounter (Signed)
PATIENT WAS A NO SHOW AND LETTER SENT  °

## 2018-09-22 ENCOUNTER — Ambulatory Visit (INDEPENDENT_AMBULATORY_CARE_PROVIDER_SITE_OTHER): Payer: Medicare Other | Admitting: Physician Assistant

## 2018-09-22 ENCOUNTER — Encounter: Payer: Self-pay | Admitting: Physician Assistant

## 2018-09-22 VITALS — BP 157/79 | HR 114 | Temp 97.1°F | Ht 67.0 in | Wt 157.6 lb

## 2018-09-22 DIAGNOSIS — F339 Major depressive disorder, recurrent, unspecified: Secondary | ICD-10-CM | POA: Diagnosis not present

## 2018-09-22 DIAGNOSIS — M25571 Pain in right ankle and joints of right foot: Secondary | ICD-10-CM | POA: Diagnosis not present

## 2018-09-22 DIAGNOSIS — M25562 Pain in left knee: Secondary | ICD-10-CM | POA: Diagnosis not present

## 2018-09-22 DIAGNOSIS — G8929 Other chronic pain: Secondary | ICD-10-CM

## 2018-09-22 MED ORDER — PREDNISONE 10 MG (21) PO TBPK: ORAL_TABLET | ORAL | 0 refills | Status: DC

## 2018-09-22 MED ORDER — RISPERIDONE 4 MG PO TABS: 4.0000 mg | ORAL_TABLET | Freq: Every day | ORAL | 0 refills | Status: DC

## 2018-09-25 NOTE — Progress Notes (Signed)
BP (!) 157/79   Pulse (!) 114   Temp (!) 97.1 F (36.2 C) (Oral)   Ht '5\' 7"'  (1.702 m)   Wt 157 lb 9.6 oz (71.5 kg)   BMI 24.68 kg/m     Subjective:    Patient ID: Nancy Burnett, female    DOB: 19-Mar-1971, 47 y.o.   MRN: [id number]  HPI: Nancy Burnett is a 47 y.o. female presenting on 09/22/2018 for Knee Pain (bilateral )  This patient comes in with significant right knee pain.  She actually has pain in both knees a lot of the time and with swelling in the area below the patella and in the supra patellar area.  On the right knee it is starting to hurt through to the back it feels swollen there.  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  We have discussed the need for orthopedic referral and she agrees.  And encouraged her to ice her knees as much as possible. In addition her depression and stress have been much worse.  She has a hard time at the holidays.  We will make adjustments in her medication for this.  Depression screen [facility] 2/9 09/22/2018 08/24/2018 05/26/2018 03/10/2018 12/19/2017  Decreased Interest '3 1 2 2 2  ' Down, Depressed, Hopeless '3 2 2 2 2  ' PHQ - 2 Score '6 3 4 4 4  ' Altered sleeping 0 0 1 1 0  Tired, decreased energy '3 3 1 1 1  ' Change in appetite 3 1 0 0 1  Feeling bad or failure about yourself  3 3 0 0 0  Trouble concentrating '3 3 1 1 2  ' Moving slowly or fidgety/restless 3 0 0 0 0  Suicidal thoughts 0 0 0 0 0  PHQ-9 Score '21 13 7 7 8     ' Past Medical History:  Diagnosis Date  . Anxiety   . Elevated WBCs   . Migraines    Relevant past medical, surgical, family and social history reviewed and updated as indicated. Interim medical history since our last visit reviewed. Allergies and medications reviewed and updated. DATA REVIEWED: CHART IN EPIC  Family History reviewed for pertinent findings.  Review of Systems  Constitutional: Negative.   HENT: Negative.   Eyes: Negative.   Respiratory: Negative.   Gastrointestinal:  Negative.   Genitourinary: Negative.   Musculoskeletal: Positive for arthralgias, joint swelling and myalgias.  Psychiatric/Behavioral: Positive for agitation, decreased concentration and dysphoric mood.    Allergies as of 09/22/2018      Reactions   Cephalexin    "does not work"      Medication List        Accurate as of 09/22/18 11:59 PM. Always use your most recent med list.          ALPRAZolam 0.5 MG tablet Commonly known as:  XANAX Take 1 tablet (0.5 mg total) by mouth 2 (two) times daily as needed for anxiety.   amphetamine-dextroamphetamine 20 MG tablet Commonly known as:  ADDERALL Take 1 tablet (20 mg total) by mouth 2 (two) times daily.   amphetamine-dextroamphetamine 20 MG tablet Commonly known as:  ADDERALL Take 1 tablet (20 mg total) by mouth 2 (two) times daily.   amphetamine-dextroamphetamine 20 MG tablet Commonly known as:  ADDERALL Take 1 tablet (20 mg total) by mouth 2 (two) times daily.   ibuprofen 600 MG tablet Commonly known as:  ADVIL,MOTRIN Take 1 tablet (600 mg  total) by mouth every 6 (six) hours as needed.   lisinopril 5 MG tablet Commonly known as:  PRINIVIL,ZESTRIL Take 1 tablet (5 mg total) by mouth daily.   loratadine 10 MG tablet Commonly known as:  CLARITIN Take 1 tablet (10 mg total) by mouth daily.   Nicotine 21-14-7 MG/24HR Kit Place 1 patch onto the skin daily.   Oxycodone HCl 20 MG Tabs Take 1 tablet (20 mg total) by mouth every 4 (four) hours.   Oxycodone HCl 20 MG Tabs Take 1 tablet (20 mg total) by mouth every 4 (four) hours.   Oxycodone HCl 20 MG Tabs Take 1 tablet (20 mg total) by mouth every 4 (four) hours.   predniSONE 10 MG (21) Tbpk tablet Commonly known as:  STERAPRED UNI-PAK 21 TAB Take as directed   pregabalin 150 MG capsule Commonly known as:  LYRICA Take 1 capsule (150 mg total) by mouth 3 (three) times daily.   PROAIR HFA 108 (90 Base) MCG/ACT inhaler Generic drug:  albuterol Inhale 2 puffs into the  lungs every 6 (six) hours as needed for wheezing or shortness of breath.   risperidone 4 MG tablet Commonly known as:  RISPERDAL Take 1 tablet (4 mg total) by mouth at bedtime.   rizatriptan 10 MG disintegrating tablet Commonly known as:  MAXALT-MLT Take 1 tablet (10 mg total) by mouth as needed for migraine. May repeat in 2 hours if needed   SUMAtriptan 100 MG tablet Commonly known as:  IMITREX Take 1 tablet (100 mg total) by mouth every 2 (two) hours as needed for migraine.   traMADol 50 MG tablet Commonly known as:  ULTRAM Take 1 tablet (50 mg total) by mouth 4 (four) times daily.   venlafaxine XR 150 MG 24 hr capsule Commonly known as:  EFFEXOR-XR Take 1 capsule (150 mg total) by mouth daily with breakfast.          Objective:    BP (!) 157/79   Pulse (!) 114   Temp (!) 97.1 F (36.2 C) (Oral)   Ht '5\' 7"'  (1.702 m)   Wt 157 lb 9.6 oz (71.5 kg)   BMI 24.68 kg/m    Allergies  Allergen Reactions  . Cephalexin     "does not work"    [employer] from Last 3 Encounters:  09/22/18 157 lb 9.6 oz (71.5 kg)  08/24/18 164 lb (74.4 kg)  05/26/18 163 lb 6.4 oz (74.1 kg)    Physical Exam  Constitutional: She is oriented to person, place, and time. She appears well-developed and well-nourished.  HENT:  Head: Normocephalic and atraumatic.  Eyes: Pupils are equal, round, and reactive to light. Conjunctivae and EOM are normal.  Cardiovascular: Normal rate, regular rhythm, normal heart sounds and intact distal pulses.  Pulmonary/Chest: Effort normal and breath sounds normal.  Abdominal: Soft. Bowel sounds are normal.  Musculoskeletal:       Right knee: She exhibits swelling. She exhibits no erythema. Tenderness found. Medial joint line tenderness noted.       Left knee: She exhibits swelling. Tenderness found. Medial joint line tenderness noted.       Legs: Neurological: She is alert and oriented to person, place, and time. She has normal reflexes.  Skin: Skin is warm and  dry. No rash noted.  Psychiatric: She has a normal mood and affect. Her behavior is normal. Judgment and thought content normal.    Results for orders placed or performed in visit on 12/19/17  [employer] Select 13 (MW),  Urine  Result Value Ref Range   Summary FINAL       Assessment & Plan:   1. Depression, recurrent ([place]) - risperiDONE (RISPERDAL) 4 MG tablet; Take 1 tablet (4 mg total) by mouth at bedtime.  Dispense: 90 tablet; Refill: 0  2. Chronic pain of left knee - predniSONE (STERAPRED UNI-PAK 21 TAB) 10 MG (21) TBPK tablet; Take as directed  Dispense: 48 tablet; Refill: 0  3. Chronic pain of right ankle - predniSONE (STERAPRED UNI-PAK 21 TAB) 10 MG (21) TBPK tablet; Take as directed  Dispense: 48 tablet; Refill: 0   Continue all other maintenance medications as listed above.  Follow up plan: No follow-ups on file.  Educational handout given for Lincoln PA-C [facility] [address]  [place], [place] [address] [phone]   09/25/2018, 1:19 PM

## 2018-10-15 ENCOUNTER — Other Ambulatory Visit: Payer: Self-pay | Admitting: Physician Assistant

## 2018-10-15 DIAGNOSIS — F411 Generalized anxiety disorder: Secondary | ICD-10-CM

## 2018-10-23 ENCOUNTER — Telehealth: Payer: Self-pay | Admitting: Physician Assistant

## 2018-10-23 ENCOUNTER — Other Ambulatory Visit: Payer: Self-pay | Admitting: Physician Assistant

## 2018-10-23 DIAGNOSIS — F411 Generalized anxiety disorder: Secondary | ICD-10-CM

## 2018-10-23 NOTE — Telephone Encounter (Signed)
Spoke with pharmacist at [facility] and they are saying pt wanted them to transfer the rx to [facility]. Advised pt rx was transferred per her request and for her to call [facility]. Pt voiced understanding.

## 2018-11-03 ENCOUNTER — Other Ambulatory Visit: Payer: Self-pay | Admitting: *Deleted

## 2018-11-03 MED ORDER — ALBUTEROL SULFATE HFA 108 (90 BASE) MCG/ACT IN AERS: INHALATION_SPRAY | RESPIRATORY_TRACT | 0 refills | Status: DC

## 2018-11-09 DIAGNOSIS — M25562 Pain in left knee: Secondary | ICD-10-CM | POA: Diagnosis not present

## 2018-11-09 DIAGNOSIS — M25561 Pain in right knee: Secondary | ICD-10-CM | POA: Diagnosis not present

## 2018-11-16 DIAGNOSIS — M25562 Pain in left knee: Secondary | ICD-10-CM | POA: Diagnosis not present

## 2018-11-23 ENCOUNTER — Ambulatory Visit (INDEPENDENT_AMBULATORY_CARE_PROVIDER_SITE_OTHER): Payer: Medicare Other | Admitting: Physician Assistant

## 2018-11-23 ENCOUNTER — Encounter: Payer: Self-pay | Admitting: Physician Assistant

## 2018-11-23 VITALS — BP 114/75 | HR 110 | Temp 98.2°F | Ht 67.0 in | Wt 160.2 lb

## 2018-11-23 DIAGNOSIS — S83282A Other tear of lateral meniscus, current injury, left knee, initial encounter: Secondary | ICD-10-CM | POA: Diagnosis not present

## 2018-11-23 DIAGNOSIS — F339 Major depressive disorder, recurrent, unspecified: Secondary | ICD-10-CM | POA: Diagnosis not present

## 2018-11-23 DIAGNOSIS — M797 Fibromyalgia: Secondary | ICD-10-CM

## 2018-11-23 DIAGNOSIS — F909 Attention-deficit hyperactivity disorder, unspecified type: Secondary | ICD-10-CM

## 2018-11-23 DIAGNOSIS — F411 Generalized anxiety disorder: Secondary | ICD-10-CM

## 2018-11-23 MED ORDER — AMPHETAMINE-DEXTROAMPHETAMINE 20 MG PO TABS: 20.0000 mg | ORAL_TABLET | Freq: Two times a day (BID) | ORAL | 0 refills | Status: DC

## 2018-11-23 MED ORDER — OXYCODONE HCL 20 MG PO TABS: 20.0000 mg | ORAL_TABLET | ORAL | 0 refills | Status: DC

## 2018-11-23 MED ORDER — PREGABALIN 200 MG PO CAPS: 200.0000 mg | ORAL_CAPSULE | Freq: Three times a day (TID) | ORAL | 5 refills | Status: DC

## 2018-11-24 DIAGNOSIS — M25561 Pain in right knee: Secondary | ICD-10-CM | POA: Diagnosis not present

## 2018-11-24 DIAGNOSIS — S83242D Other tear of medial meniscus, current injury, left knee, subsequent encounter: Secondary | ICD-10-CM | POA: Diagnosis not present

## 2018-11-24 DIAGNOSIS — M25562 Pain in left knee: Secondary | ICD-10-CM | POA: Diagnosis not present

## 2018-11-24 NOTE — Progress Notes (Addendum)
BP 114/75   Pulse (!) 110   Temp 98.2 F (36.8 C) (Oral)   Ht _0  (1.702 m)   Wt 160 lb 3.2 oz (72.7 kg)   BMI 25.09 kg/m    Subjective:    Patient ID: Nancy Burnett, female    DOB: November 11, 1970, 48 y.o.   MRN: [id number]  HPI: Nancy Burnett is a 48 y.o. female presenting on 11/23/2018 for ADHD and Pain  This patient comes in for periodic recheck on medications and conditions including chronic pain and ADHD . She will be having knee surgery for a meniscal tear. She has not had any other issues  All medications are reviewed today. There are no reports of any problems with the medications. All of the medical conditions are reviewed and updated.  Lab work is reviewed and will be ordered as medically necessary. There are no new problems reported with today's visit.  This patient returns for a 6 month recheck on narcotic use for ADHD, chronic pain, fibromyalgia and medication refills  Patient currently taking adderall. Alprazolam,oxycodone . Behavior- normal Medication side effects- none Any concerns- no  PMP AWARE website reviewed: Yes Any suspicious activity on PMP Aware: No  Past Medical History:  Diagnosis Date  . Anxiety   . Elevated WBCs   . Migraines    Relevant past medical, surgical, family and social history reviewed and updated as indicated. Interim medical history since our last visit reviewed. Allergies and medications reviewed and updated. DATA REVIEWED: CHART IN EPIC  Family History reviewed for pertinent findings.  Review of Systems  Constitutional: Negative.   HENT: Negative.   Eyes: Negative.   Respiratory: Negative.   Gastrointestinal: Negative.   Genitourinary: Negative.     Allergies as of 11/23/2018      Reactions   Cephalexin    "does not work"      Medication List       Accurate as of November 23, 2018 11:59 PM. Always use your most recent med list.        albuterol 108 (90 Base) MCG/ACT inhaler Commonly known as:  PROAIR HFA Inhale 2  puffs into the lungs every 6 (six) hours as needed for wheezing or shortness of breath.   ALPRAZolam 0.5 MG tablet Commonly known as:  XANAX TAKE (1) TABLET BY MOUTH TWICE DAILY AS NEEDED FOR ANXIETY.   amphetamine-dextroamphetamine 20 MG tablet Commonly known as:  ADDERALL Take 1 tablet (20 mg total) by mouth 2 (two) times daily.   amphetamine-dextroamphetamine 20 MG tablet Commonly known as:  ADDERALL Take 1 tablet (20 mg total) by mouth 2 (two) times daily.   amphetamine-dextroamphetamine 20 MG tablet Commonly known as:  ADDERALL Take 1 tablet (20 mg total) by mouth 2 (two) times daily.   ibuprofen 600 MG tablet Commonly known as:  ADVIL,MOTRIN Take 1 tablet (600 mg total) by mouth every 6 (six) hours as needed.   lisinopril 5 MG tablet Commonly known as:  PRINIVIL,ZESTRIL Take 1 tablet (5 mg total) by mouth daily.   loratadine 10 MG tablet Commonly known as:  CLARITIN Take 1 tablet (10 mg total) by mouth daily.   Nicotine 21-14-7 MG/24HR Kit Place 1 patch onto the skin daily.   Oxycodone HCl 20 MG Tabs Take 1 tablet (20 mg total) by mouth every 4 (four) hours.   Oxycodone HCl 20 MG Tabs Take 1 tablet (20 mg total) by mouth every 4 (four) hours.   Oxycodone HCl 20 MG Tabs Take  1 tablet (20 mg total) by mouth every 4 (four) hours.   pregabalin 200 MG capsule Commonly known as:  LYRICA Take 1 capsule (200 mg total) by mouth 3 (three) times daily.   risperidone 4 MG tablet Commonly known as:  RISPERDAL Take 1 tablet (4 mg total) by mouth at bedtime.   rizatriptan 10 MG disintegrating tablet Commonly known as:  MAXALT-MLT Take 1 tablet (10 mg total) by mouth as needed for migraine. May repeat in 2 hours if needed   SUMAtriptan 100 MG tablet Commonly known as:  IMITREX Take 1 tablet (100 mg total) by mouth every 2 (two) hours as needed for migraine.   traMADol 50 MG tablet Commonly known as:  ULTRAM Take 1 tablet (50 mg total) by mouth 4 (four) times daily.    venlafaxine XR 150 MG 24 hr capsule Commonly known as:  EFFEXOR XR Take 1 capsule (150 mg total) by mouth daily with breakfast.          Objective:    BP 114/75   Pulse (!) 110   Temp 98.2 F (36.8 C) (Oral)   Ht _0  (1.702 m)   Wt 160 lb 3.2 oz (72.7 kg)   BMI 25.09 kg/m   Allergies  Allergen Reactions  . Cephalexin     "does not work"    [employer] from Last 3 Encounters:  11/23/18 160 lb 3.2 oz (72.7 kg)  09/22/18 157 lb 9.6 oz (71.5 kg)  08/24/18 164 lb (74.4 kg)    Physical Exam Constitutional:      Appearance: She is well-developed.  HENT:     Head: Normocephalic and atraumatic.  Eyes:     Conjunctiva/sclera: Conjunctivae normal.     Pupils: Pupils are equal, round, and reactive to light.  Cardiovascular:     Rate and Rhythm: Normal rate and regular rhythm.     Heart sounds: Normal heart sounds.  Pulmonary:     Effort: Pulmonary effort is normal.     Breath sounds: Normal breath sounds.  Abdominal:     General: Bowel sounds are normal.     Palpations: Abdomen is soft.  Skin:    General: Skin is warm and dry.     Findings: No rash.  Neurological:     Mental Status: She is alert and oriented to person, place, and time.     Deep Tendon Reflexes: Reflexes are normal and symmetric.  Psychiatric:        Behavior: Behavior normal.        Thought Content: Thought content normal.        Judgment: Judgment normal.     Results for orders placed or performed in visit on 12/19/17  [employer] Select 13 (MW), Urine  Result Value Ref Range   Summary FINAL       Assessment & Plan:   1. Acute lateral meniscus tear of left knee, initial encounter Continue orthopedic treatment  2. Attention deficit hyperactivity disorder (ADHD), unspecified ADHD type - amphetamine-dextroamphetamine (ADDERALL) 20 MG tablet; Take 1 tablet (20 mg total) by mouth 2 (two) times daily.  Dispense: 60 tablet; Refill: 0 - amphetamine-dextroamphetamine (ADDERALL) 20 MG tablet; Take  1 tablet (20 mg total) by mouth 2 (two) times daily.  Dispense: 60 tablet; Refill: 0 - amphetamine-dextroamphetamine (ADDERALL) 20 MG tablet; Take 1 tablet (20 mg total) by mouth 2 (two) times daily.  Dispense: 60 tablet; Refill: 0  3. Fibromyalgia - pregabalin (LYRICA) 200 MG capsule; Take 1 capsule (200 mg  total) by mouth 3 (three) times daily.  Dispense: 90 capsule; Refill: 5 - Oxycodone HCl 20 MG TABS; Take 1 tablet (20 mg total) by mouth every 4 (four) hours.  Dispense: 150 tablet; Refill: 0 - Oxycodone HCl 20 MG TABS; Take 1 tablet (20 mg total) by mouth every 4 (four) hours.  Dispense: 150 tablet; Refill: 0 - Oxycodone HCl 20 MG TABS; Take 1 tablet (20 mg total) by mouth every 4 (four) hours.  Dispense: 150 tablet; Refill: 0  4. Depression, recurrent ([place]) Continue medications  5. GAD (generalized anxiety disorder) Continue medications  Continue all other maintenance medications as listed above.  Follow up plan: Return in about 3 months (around 02/21/2019).  Educational handout given for [place] PA-C [facility] [address]  [place], [place] [address] [phone]   11/24/2018, 8:54 PM

## 2018-12-02 ENCOUNTER — Encounter: Payer: Self-pay | Admitting: *Deleted

## 2018-12-07 ENCOUNTER — Other Ambulatory Visit: Payer: Self-pay | Admitting: Physician Assistant

## 2018-12-14 ENCOUNTER — Telehealth: Payer: Self-pay | Admitting: Physician Assistant

## 2018-12-14 NOTE — Telephone Encounter (Signed)
appt scheduled Pt notified 

## 2018-12-15 ENCOUNTER — Ambulatory Visit: Payer: Medicare Other | Admitting: Family Medicine

## 2018-12-17 ENCOUNTER — Other Ambulatory Visit: Payer: Self-pay | Admitting: Physician Assistant

## 2018-12-17 DIAGNOSIS — M797 Fibromyalgia: Secondary | ICD-10-CM

## 2018-12-17 MED ORDER — PREGABALIN 200 MG PO CAPS: 200.0000 mg | ORAL_CAPSULE | Freq: Three times a day (TID) | ORAL | 1 refills | Status: DC

## 2018-12-24 ENCOUNTER — Encounter: Payer: Self-pay | Admitting: Physician Assistant

## 2019-02-08 ENCOUNTER — Ambulatory Visit: Payer: Medicare Other | Admitting: Physician Assistant

## 2019-02-10 ENCOUNTER — Ambulatory Visit: Payer: Medicare Other | Admitting: Physician Assistant

## 2019-02-16 ENCOUNTER — Encounter: Payer: Self-pay | Admitting: Physician Assistant

## 2019-02-16 ENCOUNTER — Other Ambulatory Visit: Payer: Self-pay | Admitting: Family Medicine

## 2019-02-16 ENCOUNTER — Ambulatory Visit (INDEPENDENT_AMBULATORY_CARE_PROVIDER_SITE_OTHER): Payer: Medicare Other | Admitting: Physician Assistant

## 2019-02-16 ENCOUNTER — Other Ambulatory Visit: Payer: Self-pay

## 2019-02-16 DIAGNOSIS — M5136 Other intervertebral disc degeneration, lumbar region: Secondary | ICD-10-CM | POA: Diagnosis not present

## 2019-02-16 DIAGNOSIS — M797 Fibromyalgia: Secondary | ICD-10-CM | POA: Diagnosis not present

## 2019-02-16 DIAGNOSIS — F411 Generalized anxiety disorder: Secondary | ICD-10-CM

## 2019-02-16 DIAGNOSIS — S83282A Other tear of lateral meniscus, current injury, left knee, initial encounter: Secondary | ICD-10-CM | POA: Diagnosis not present

## 2019-02-16 DIAGNOSIS — F909 Attention-deficit hyperactivity disorder, unspecified type: Secondary | ICD-10-CM | POA: Diagnosis not present

## 2019-02-16 MED ORDER — AMPHETAMINE-DEXTROAMPHETAMINE 20 MG PO TABS: 20.0000 mg | ORAL_TABLET | Freq: Two times a day (BID) | ORAL | 0 refills | Status: DC

## 2019-02-16 MED ORDER — PREGABALIN 200 MG PO CAPS: 200.0000 mg | ORAL_CAPSULE | Freq: Three times a day (TID) | ORAL | 1 refills | Status: AC

## 2019-02-16 MED ORDER — NALOXONE HCL 4 MG/0.1ML NA LIQD: 1.0000 | Freq: Once | NASAL | 1 refills | Status: AC

## 2019-02-16 MED ORDER — AZITHROMYCIN 250 MG PO TABS: ORAL_TABLET | ORAL | 0 refills | Status: DC

## 2019-02-16 MED ORDER — OXYCODONE HCL 15 MG PO TABS: 15.0000 mg | ORAL_TABLET | ORAL | 0 refills | Status: DC | PRN

## 2019-02-16 MED ORDER — ALBUTEROL SULFATE HFA 108 (90 BASE) MCG/ACT IN AERS: INHALATION_SPRAY | RESPIRATORY_TRACT | 1 refills | Status: DC

## 2019-02-16 NOTE — Telephone Encounter (Signed)
Seen today. 

## 2019-02-16 NOTE — Progress Notes (Signed)
Telephone visit  Subjective: TC:Nancy Burnett, chronic care management PCP: Terald Sleeper, PA-C PET:Nancy Burnett A [name] is a 48 y.o. female calls for telephone consult today. Patient provides verbal consent for consult held via phone.  Patient is identified with 2 separate identifiers.  At this time the entire area is on COVID-19 social distancing and stay home orders are in place.  Patient is of higher risk and therefore we are performing this by a virtual method.  Location of patient: home Location of provider: [facility] Others present for call: no  This is a periodic recheck for the patient's chronic medical conditions of fibromyalgia and ADHD.  She also has generalized anxiety.  All of her medications are reviewed.  I have discussed with her the need to lower her oxycodone.  The goal is for [place] to get down to the 5 mg dose.  She is going to go from the 20 to the 15 mg this month and we will recheck her in 1 month on this.  She does need a knee surgery and has had it postponed due to the COVID-19.  She had a severe torn meniscus in her knee.  However she did take the medication prior to that for DDD, chronic pain   PAIN ASSESSMENT: Cause of pain-degenerative disc disease, meniscal tear She is awaiting surgery for knee  Slight degenerative disc disease at L4-5 with slight left facet arthritis slightly narrowing the left neural foramen without discrete impingement upon the exiting left L4 nerve. This patient returns for a 3 month recheck on narcotic use for the above named conditions  Current medications-oxycodone 20 mg 1 every 4 hours as needed for severe pain number 150/month Lyrica 200 mg 3 times a day We are lowering her to oxycodone 15 mg with the same instructions for 1 month.  We will see her back in the office after that. Medication side effects- no Any concerns- yes, needing to lower medication  Pain on scale of 1-10- 8 to 9 Frequency- daily What increases pain- walking What  makes pain Better- rest, medications Effects on ADL - moderate Any change in general medical condition- no  Effectiveness of current meds- good Adverse reactions form pain meds-no PMP AWARE website reviewed: Yes Any suspicious activity on PMP Aware: No MME daily dose: 150 MME Goal of 50 MME,  Long discussion with the patient concerning this.  If she does not feel that she can handle the lowering of the medication we can definitely make referral to the pain clinic. LME daily dose: 2 Starting the titration of alprazolam, reduce by 1/2 tab per day.  We will recheck in 1 month at her next visit  Contract on file 08/27/18 Last UDS  12/20/18  [facility] script sent or current  History of overdose or risk of abuse no   Patient with several days of progressing upper respiratory and bronchial symptoms. Initially there was more upper respiratory congestion. This progressed to having significant cough that is productive throughout the day and severe at night. There is occasional wheezing after coughing. Sometimes there is slight dyspnea on exertion. It is productive mucus that is yellow in color. Denies any blood.   ROS: Per HPI  Allergies  Allergen Reactions  . Cephalexin     "does not work"   Past Medical History:  Diagnosis Date  . Anxiety   . Elevated WBCs   . Migraines     Current Outpatient Medications:  .  albuterol (PROAIR HFA) 108 (90  Base) MCG/ACT inhaler, Inhale 2 puffs into the lungs every 6 (six) hours as needed for wheezing or shortness of breath., Disp: 3 Inhaler, Rfl: 1 .  ALPRAZolam (XANAX) 0.5 MG tablet, TAKE (1) TABLET BY MOUTH TWICE DAILY AS NEEDED FOR ANXIETY., Disp: 60 tablet, Rfl: 5 .  amphetamine-dextroamphetamine (ADDERALL) 20 MG tablet, Take 1 tablet (20 mg total) by mouth 2 (two) times daily., Disp: 60 tablet, Rfl: 0 .  amphetamine-dextroamphetamine (ADDERALL) 20 MG tablet, Take 1 tablet (20 mg total) by mouth 2 (two) times daily., Disp: 60 tablet, Rfl: 0 .   amphetamine-dextroamphetamine (ADDERALL) 20 MG tablet, Take 1 tablet (20 mg total) by mouth 2 (two) times daily., Disp: 60 tablet, Rfl: 0 .  azithromycin (ZITHROMAX Z-PAK) 250 MG tablet, Take as directed, Disp: 6 each, Rfl: 0 .  ibuprofen (ADVIL,MOTRIN) 600 MG tablet, Take 1 tablet (600 mg total) by mouth every 6 (six) hours as needed., Disp: 30 tablet, Rfl: 0 .  lisinopril (PRINIVIL,ZESTRIL) 5 MG tablet, Take 1 tablet (5 mg total) by mouth daily., Disp: 90 tablet, Rfl: 3 .  loratadine (CLARITIN) 10 MG tablet, Take 1 tablet (10 mg total) by mouth daily., Disp: 30 tablet, Rfl: 11 .  naloxone (NARCAN) nasal spray 4 mg/0.1 mL, Place 1 spray into the nose once for 1 dose., Disp: 1 kit, Rfl: 1 .  Nicotine 21-14-7 MG/24HR KIT, Place 1 patch onto the skin daily., Disp: 28 each, Rfl: 2 .  oxyCODONE (ROXICODONE) 15 MG immediate release tablet, Take 1 tablet (15 mg total) by mouth every 4 (four) hours as needed for pain., Disp: 150 tablet, Rfl: 0 .  pregabalin (LYRICA) 200 MG capsule, Take 1 capsule (200 mg total) by mouth 3 (three) times daily., Disp: 270 capsule, Rfl: 1 .  risperiDONE (RISPERDAL) 4 MG tablet, Take 1 tablet (4 mg total) by mouth at bedtime., Disp: 90 tablet, Rfl: 0 .  rizatriptan (MAXALT-MLT) 10 MG disintegrating tablet, Take 1 tablet (10 mg total) by mouth as needed for migraine. May repeat in 2 hours if needed, Disp: 10 tablet, Rfl: 11 .  SUMAtriptan (IMITREX) 100 MG tablet, Take 1 tablet (100 mg total) by mouth every 2 (two) hours as needed for migraine., Disp: 10 tablet, Rfl: 5 .  venlafaxine XR (EFFEXOR-XR) 150 MG 24 hr capsule, Take 1 capsule (150 mg total) by mouth daily with breakfast., Disp: 90 capsule, Rfl: 3  Assessment/ Plan: 47 y.o. female   1. Acute lateral meniscus tear of left knee, initial encounter - ToxASSURE Select 13 (MW), Urine; Future  2. Attention deficit hyperactivity disorder (ADHD), unspecified ADHD type - amphetamine-dextroamphetamine (ADDERALL) 20 MG tablet;  Take 1 tablet (20 mg total) by mouth 2 (two) times daily.  Dispense: 60 tablet; Refill: 0 - amphetamine-dextroamphetamine (ADDERALL) 20 MG tablet; Take 1 tablet (20 mg total) by mouth 2 (two) times daily.  Dispense: 60 tablet; Refill: 0 - amphetamine-dextroamphetamine (ADDERALL) 20 MG tablet; Take 1 tablet (20 mg total) by mouth 2 (two) times daily.  Dispense: 60 tablet; Refill: 0 - ToxASSURE Select 13 (MW), Urine; Future  3. Fibromyalgia - pregabalin (LYRICA) 200 MG capsule; Take 1 capsule (200 mg total) by mouth 3 (three) times daily.  Dispense: 270 capsule; Refill: 1 - ToxASSURE Select 13 (MW), Urine; Future  4. DDD (degenerative disc disease), lumbar - ToxASSURE Select 13 (MW), Urine; Future  5. GAD (generalized anxiety disorder) - ToxASSURE Select 13 (MW), Urine; Future   Start time: 9:15 AM End time: 9:32 AM  Meds ordered this  encounter  Medications  . naloxone (NARCAN) nasal spray 4 mg/0.1 mL    Sig: Place 1 spray into the nose once for 1 dose.    Dispense:  1 kit    Refill:  1    Order Specific Question:   Supervising Provider    Answer:   Janora Norlander [[id number]]  . oxyCODONE (ROXICODONE) 15 MG immediate release tablet    Sig: Take 1 tablet (15 mg total) by mouth every 4 (four) hours as needed for pain.    Dispense:  150 tablet    Refill:  0    Order Specific Question:   Supervising Provider    Answer:   Janora Norlander [[id number]]  . amphetamine-dextroamphetamine (ADDERALL) 20 MG tablet    Sig: Take 1 tablet (20 mg total) by mouth 2 (two) times daily.    Dispense:  60 tablet    Refill:  0    Fill 30 days from original script date    Order Specific Question:   Supervising Provider    Answer:   Janora Norlander [[id number]]  . amphetamine-dextroamphetamine (ADDERALL) 20 MG tablet    Sig: Take 1 tablet (20 mg total) by mouth 2 (two) times daily.    Dispense:  60 tablet    Refill:  0    Fill 60 days from original script date    Order Specific Question:    Supervising Provider    Answer:   Janora Norlander [[id number]]  . amphetamine-dextroamphetamine (ADDERALL) 20 MG tablet    Sig: Take 1 tablet (20 mg total) by mouth 2 (two) times daily.    Dispense:  60 tablet    Refill:  0    Order Specific Question:   Supervising Provider    Answer:   Janora Norlander [[id number]]  . azithromycin (ZITHROMAX Z-PAK) 250 MG tablet    Sig: Take as directed    Dispense:  6 each    Refill:  0    Order Specific Question:   Supervising Provider    Answer:   Janora Norlander [[id number]]  . pregabalin (LYRICA) 200 MG capsule    Sig: Take 1 capsule (200 mg total) by mouth 3 (three) times daily.    Dispense:  270 capsule    Refill:  1    Order Specific Question:   Supervising Provider    Answer:   Janora Norlander [[id number]]  . albuterol (PROAIR HFA) 108 (90 Base) MCG/ACT inhaler    Sig: Inhale 2 puffs into the lungs every 6 (six) hours as needed for wheezing or shortness of breath.    Dispense:  3 Inhaler    Refill:  1    Order Specific Question:   Supervising Provider    Answer:   Janora Norlander [[id number]]    Particia Nearing PA-C [place] [phone]

## 2019-02-19 ENCOUNTER — Other Ambulatory Visit: Payer: Self-pay | Admitting: *Deleted

## 2019-02-19 DIAGNOSIS — G43809 Other migraine, not intractable, without status migrainosus: Secondary | ICD-10-CM

## 2019-02-19 MED ORDER — SUMATRIPTAN SUCCINATE 100 MG PO TABS: 100.0000 mg | ORAL_TABLET | ORAL | 5 refills | Status: AC | PRN

## 2019-02-23 ENCOUNTER — Telehealth: Payer: Self-pay | Admitting: Physician Assistant

## 2019-02-24 NOTE — Progress Notes (Signed)
Agree with taper off of the Oxycodone.  She is on quite a few controlled substances and polypharmacy is a concern.  Would favor alternative therapies (back injections/ physical therapy/ chiropractic medicine/ PM&R, etc) for her chronic back pain.  Recommend tapering off Xanax and transition to Atarax or Buspar as anxiolytic.  She is on max dose of Lyrica.  Additionally, I reviewed her 12/2017 UDS results which were not appropriate (hydromorphone noted and xanax absent).  Was this ever addressed?  I see a future order for a UDS but none ever collected.  Would not recommend continuing to fill controlled substances for this patient until this can be clarified.  Nalin Mazzocco M. Nadine Counts, DO [facility]

## 2019-02-25 ENCOUNTER — Telehealth: Payer: Self-pay | Admitting: *Deleted

## 2019-02-25 ENCOUNTER — Encounter (INDEPENDENT_AMBULATORY_CARE_PROVIDER_SITE_OTHER): Payer: Self-pay

## 2019-02-25 ENCOUNTER — Telehealth: Payer: Self-pay | Admitting: Physician Assistant

## 2019-02-25 NOTE — Telephone Encounter (Signed)
Patient would like referral to Dr. Plummer [facility] for pain management 

## 2019-02-26 ENCOUNTER — Other Ambulatory Visit: Payer: Self-pay | Admitting: Physician Assistant

## 2019-02-26 DIAGNOSIS — M51369 Other intervertebral disc degeneration, lumbar region without mention of lumbar back pain or lower extremity pain: Secondary | ICD-10-CM

## 2019-02-26 DIAGNOSIS — G894 Chronic pain syndrome: Secondary | ICD-10-CM

## 2019-02-26 DIAGNOSIS — M5136 Other intervertebral disc degeneration, lumbar region: Secondary | ICD-10-CM

## 2019-02-26 DIAGNOSIS — M797 Fibromyalgia: Secondary | ICD-10-CM

## 2019-02-26 NOTE — Telephone Encounter (Signed)
Order has been placed.

## 2019-03-17 ENCOUNTER — Encounter: Payer: Self-pay | Admitting: Physician Assistant

## 2019-03-17 ENCOUNTER — Ambulatory Visit (INDEPENDENT_AMBULATORY_CARE_PROVIDER_SITE_OTHER): Payer: Medicare Other | Admitting: Physician Assistant

## 2019-03-17 ENCOUNTER — Other Ambulatory Visit: Payer: Self-pay

## 2019-03-17 VITALS — BP 120/83 | HR 102 | Temp 99.0°F | Ht 67.0 in | Wt 159.8 lb

## 2019-03-17 DIAGNOSIS — F411 Generalized anxiety disorder: Secondary | ICD-10-CM

## 2019-03-17 DIAGNOSIS — F909 Attention-deficit hyperactivity disorder, unspecified type: Secondary | ICD-10-CM

## 2019-03-17 DIAGNOSIS — M797 Fibromyalgia: Secondary | ICD-10-CM

## 2019-03-17 DIAGNOSIS — F339 Major depressive disorder, recurrent, unspecified: Secondary | ICD-10-CM

## 2019-03-17 DIAGNOSIS — M5136 Other intervertebral disc degeneration, lumbar region: Secondary | ICD-10-CM | POA: Diagnosis not present

## 2019-03-17 DIAGNOSIS — S83282A Other tear of lateral meniscus, current injury, left knee, initial encounter: Secondary | ICD-10-CM

## 2019-03-17 MED ORDER — OXYCODONE HCL 20 MG PO TABS: 20.0000 mg | ORAL_TABLET | ORAL | 0 refills | Status: DC | PRN

## 2019-03-17 MED ORDER — ALPRAZOLAM 0.5 MG PO TABS: ORAL_TABLET | ORAL | 5 refills | Status: DC

## 2019-03-17 MED ORDER — OXYCODONE HCL 20 MG PO TABS: 15.0000 mg | ORAL_TABLET | ORAL | 0 refills | Status: DC | PRN

## 2019-03-17 NOTE — Progress Notes (Signed)
degenerative disc disease at L4-5 with slight left facet arthritis slightly narrowing the left neural foramen  [facility] Dr Mirna Mires  Dr [facility] psychiatry    BP 120/83   Pulse (!) 102   Temp 99 F (37.2 C) (Oral)   Ht '5\' 7"'  (1.702 m)   Wt 159 lb 12.8 oz (72.5 kg)   BMI 25.03 kg/m    Subjective:    Patient ID: Nancy Burnett, female    DOB: 1971-08-21, 48 y.o.   MRN: [id number]  HPI: Nancy Burnett is a 48 y.o. female presenting on 03/17/2019 for Pain (3 month) and ADHD  At this time a referral has been placed for both chronic pain and psychiatry.  Se did not feel that she could taper the medication as dicussed at her last visit.  We are waiting to hear from the specialists. She will be having knee surgery in the near future, was postponed because of COVID.   PAIN ASSESSMENT: Cause of pain- DDD lumbar Lateral meniscal tear, under orthopedic care  Slight degenerative disc disease at L4-5 with slight left facet arthritis slightly narrowing the left neural foramen without discrete impingement upon the exiting left L4 nerve.  This patient returns for a 3 month recheck on narcotic use for the above named conditions  Current medications- Oxycodone 20 mg one every 4-6 hours lyrica  290m TID Ibuprofen 600 mg TID  effexor Risperidone Alprazolam 0.5 mg BID prn anxiety Adderall  Medication side effects- nono Any concerns- no  Pain on scale of 1-10- 9 Frequency- daily What increases pain- standing, walking What makes pain Better- nothing Effects on ADL - moderate Any change in general medical condition- no  Effectiveness of current meds- good Adverse reactions form pain meds-no PMP AWARE website reviewed: Yes Any suspicious activity on PMP Aware: No MME daily dose: 112 LME daily dose: 4  Contract on file  Last UDS  03/16/18  [facility]  History of overdose or risk of abuse no    Past Medical History:  Diagnosis Date  . Anxiety   . Elevated  WBCs   . Migraines    Relevant past medical, surgical, family and social history reviewed and updated as indicated. Interim medical history since our last visit reviewed. Allergies and medications reviewed and updated. DATA REVIEWED: CHART IN EPIC  Family History reviewed for pertinent findings.  Review of Systems  Constitutional: Negative.   HENT: Negative.   Eyes: Negative.   Respiratory: Negative.   Gastrointestinal: Negative.   Genitourinary: Negative.   Musculoskeletal: Positive for arthralgias, back pain, joint swelling and myalgias.  Neurological: Positive for weakness.  Psychiatric/Behavioral: Positive for decreased concentration and dysphoric mood. The patient is nervous/anxious.     Allergies as of 03/17/2019      Reactions   Cephalexin    "does not work"      Medication List       Accurate as of Mar 17, 2019 11:59 PM. If you have any questions, ask your nurse or doctor.        STOP taking these medications   azithromycin 250 MG tablet Commonly known as:  Zithromax Z-Pak Stopped by:  ATerald Sleeper PA-C     TAKE these medications   albuterol 108 (90 Base) MCG/ACT inhaler Commonly known as:  ProAir HFA Inhale 2 puffs into the lungs every 6 (six) hours as needed for wheezing or shortness of breath.   ALPRAZolam 0.5 MG tablet Commonly known as:  XANAX TAKE (1)  TABLET BY MOUTH TWICE DAILY AS NEEDED FOR ANXIETY.   amphetamine-dextroamphetamine 20 MG tablet Commonly known as:  Adderall Take 1 tablet (20 mg total) by mouth 2 (two) times daily.   amphetamine-dextroamphetamine 20 MG tablet Commonly known as:  Adderall Take 1 tablet (20 mg total) by mouth 2 (two) times daily.   amphetamine-dextroamphetamine 20 MG tablet Commonly known as:  Adderall Take 1 tablet (20 mg total) by mouth 2 (two) times daily.   ibuprofen 600 MG tablet Commonly known as:  ADVIL Take 1 tablet (600 mg total) by mouth every 6 (six) hours as needed.   lisinopril 5 MG tablet  Commonly known as:  ZESTRIL Take 1 tablet (5 mg total) by mouth daily.   loratadine 10 MG tablet Commonly known as:  Claritin Take 1 tablet (10 mg total) by mouth daily.   Nicotine 21-14-7 MG/24HR Kit Place 1 patch onto the skin daily.   Oxycodone HCl 20 MG Tabs Take 1 tablet (20 mg total) by mouth every 4 (four) hours as needed. What changed:    medication strength  how much to take  reasons to take this Changed by:  Terald Sleeper, PA-C   pregabalin 200 MG capsule Commonly known as:  LYRICA Take 1 capsule (200 mg total) by mouth 3 (three) times daily.   risperidone 4 MG tablet Commonly known as:  RISPERDAL Take 1 tablet (4 mg total) by mouth at bedtime.   rizatriptan 10 MG disintegrating tablet Commonly known as:  MAXALT-MLT Take 1 tablet (10 mg total) by mouth as needed for migraine. May repeat in 2 hours if needed   SUMAtriptan 100 MG tablet Commonly known as:  IMITREX Take 1 tablet (100 mg total) by mouth every 2 (two) hours as needed for migraine.   venlafaxine XR 150 MG 24 hr capsule Commonly known as:  Effexor XR Take 1 capsule (150 mg total) by mouth daily with breakfast.          Objective:    BP 120/83   Pulse (!) 102   Temp 99 F (37.2 C) (Oral)   Ht '5\' 7"'  (1.702 m)   Wt 159 lb 12.8 oz (72.5 kg)   BMI 25.03 kg/m   Allergies  Allergen Reactions  . Cephalexin     "does not work"    [employer] from Last 3 Encounters:  03/17/19 159 lb 12.8 oz (72.5 kg)  11/23/18 160 lb 3.2 oz (72.7 kg)  09/22/18 157 lb 9.6 oz (71.5 kg)    Physical Exam Constitutional:      Appearance: She is well-developed.  HENT:     Head: Normocephalic and atraumatic.  Eyes:     Conjunctiva/sclera: Conjunctivae normal.     Pupils: Pupils are equal, round, and reactive to light.  Cardiovascular:     Rate and Rhythm: Normal rate and regular rhythm.     Heart sounds: Normal heart sounds.  Pulmonary:     Effort: Pulmonary effort is normal.     Breath sounds: Normal  breath sounds.  Abdominal:     General: Bowel sounds are normal.     Palpations: Abdomen is soft.  Skin:    General: Skin is warm and dry.     Findings: No rash.  Neurological:     Mental Status: She is alert and oriented to person, place, and time.     Deep Tendon Reflexes: Reflexes are normal and symmetric.  Psychiatric:        Behavior: Behavior normal.  Thought Content: Thought content normal.        Judgment: Judgment normal.     Results for orders placed or performed in visit on 12/19/17  ToxASSURE Select 13 (MW), Urine  Result Value Ref Range   Summary FINAL       Assessment & Plan:   1. GAD (generalized anxiety disorder) Psychiatry referral placed, Dr. [facility] - ALPRAZolam Duanne Moron) 0.5 MG tablet; TAKE (1) TABLET BY MOUTH TWICE DAILY AS NEEDED FOR ANXIETY.  Dispense: 60 tablet; Refill: 5 - ToxASSURE Select 13 (MW), Urine  2. DDD (degenerative disc disease), lumbar Pain Clinic referral placed, [facility], Dr. Mirna Mires - ToxASSURE Select 13 (MW), Urine - Oxycodone HCl 20 MG TABS; Take 1 tablet (20 mg total) by mouth every 4 (four) hours as needed.  Dispense: 150 tablet; Refill: 0  3. Depression, recurrent [facility]) Referral placed for psychiatry  4. Attention deficit hyperactivity disorder (ADHD), unspecified ADHD type Referral placed for psychiatry - ToxASSURE Select 13 (MW), Urine  5. Acute lateral meniscus tear of left knee, initial encounter - ToxASSURE Select 13 (MW), Urine  6. Fibromyalgia - ToxASSURE Select 13 (MW), Urine   Continue all other maintenance medications as listed above.  Follow up plan: Return in about 3 months (around 06/17/2019) for recheck.  Educational handout given for [place] PA-C [place] [address]  [place], [place] [address] [phone]   03/23/2019, 1:02 PM

## 2019-03-23 ENCOUNTER — Other Ambulatory Visit: Payer: Self-pay

## 2019-03-23 ENCOUNTER — Encounter: Payer: Self-pay | Admitting: *Deleted

## 2019-03-23 ENCOUNTER — Ambulatory Visit (INDEPENDENT_AMBULATORY_CARE_PROVIDER_SITE_OTHER): Payer: Medicare Other | Admitting: *Deleted

## 2019-03-23 DIAGNOSIS — Z0000 Encounter for general adult medical examination without abnormal findings: Secondary | ICD-10-CM | POA: Diagnosis not present

## 2019-03-23 LAB — TOXASSURE SELECT 13 (MW), URINE

## 2019-03-23 NOTE — Patient Instructions (Signed)
  Ms. Plautz , Thank you for taking time to come for your Medicare Wellness Visit. I appreciate your ongoing commitment to your health goals. Please review the following plan we discussed and let me know if I can assist you in the future.   These are the goals we discussed: Goals    . Exercise 3x per week (30 min per time)       This is a list of the screening recommended for you and due dates:  Health Maintenance  Topic Date Due  . HIV Screening  03/11/1987  . Tetanus Vaccine  03/11/1991  . Pap Smear  03/10/1993  . Flu Shot  05/22/2019

## 2019-03-23 NOTE — Progress Notes (Signed)
MEDICARE ANNUAL WELLNESS VISIT  03/23/2019  Telephone Visit Disclaimer This Medicare AWV was conducted by telephone due to national recommendations for restrictions regarding the COVID-19 Pandemic (e.g. social distancing).  I verified, using two identifiers, that I am speaking with Nancy Burnett or their authorized healthcare agent. I discussed the limitations, risks, security, and privacy concerns of performing an evaluation and management service by telephone and the potential availability of an in-person appointment in the future. The patient expressed understanding and agreed to proceed.   Subjective:  Nancy Burnett is a 48 y.o. female patient of Nancy Sleeper, PA-C who had a Medicare Annual Wellness Visit today via telephone. Nancy Burnett is Disabled and lives with their spouse. she has 2 children. she reports that she is socially active and does interact with friends/family regularly. she is not physically active and has not hobbies at this time.  Patient Care Team: Theodoro Clock as PCP - General (Physician Assistant) Danie Binder, MD as Consulting Physician (Gastroenterology)  Advanced Directives 03/23/2019 09/26/2015 03/16/2015  Does Patient Have a Medical Advance Directive? No No No  Would patient like information on creating a medical advance directive? Yes (MAU/Ambulatory/Procedural Areas - Information given) No - patient declined information -    Hospital Utilization Over the Past 12 Months: # of hospitalizations or ER visits: 0 # of surgeries: 0  Review of Systems    Patient reports that her overall health is unchanged compared to last year.  Patient Reported Readings (BP, Pulse, CBG, Weight, etc) none  Review of Systems: History obtained from chart review General ROS: negative  All other systems negative.  Pain Assessment Pain : No/denies pain     Current Medications & Allergies (verified) Allergies as of 03/23/2019      Reactions   Cephalexin    "does  not work"      Medication List       Accurate as of March 23, 2019  3:12 PM. If you have any questions, ask your nurse or doctor.        albuterol 108 (90 Base) MCG/ACT inhaler Commonly known as:  ProAir HFA Inhale 2 puffs into the lungs every 6 (six) hours as needed for wheezing or shortness of breath.   ALPRAZolam 0.5 MG tablet Commonly known as:  XANAX TAKE (1) TABLET BY MOUTH TWICE DAILY AS NEEDED FOR ANXIETY.   amphetamine-dextroamphetamine 20 MG tablet Commonly known as:  Adderall Take 1 tablet (20 mg total) by mouth 2 (two) times daily.   amphetamine-dextroamphetamine 20 MG tablet Commonly known as:  Adderall Take 1 tablet (20 mg total) by mouth 2 (two) times daily.   amphetamine-dextroamphetamine 20 MG tablet Commonly known as:  Adderall Take 1 tablet (20 mg total) by mouth 2 (two) times daily.   ibuprofen 600 MG tablet Commonly known as:  ADVIL Take 1 tablet (600 mg total) by mouth every 6 (six) hours as needed.   lisinopril 5 MG tablet Commonly known as:  ZESTRIL Take 1 tablet (5 mg total) by mouth daily.   loratadine 10 MG tablet Commonly known as:  Claritin Take 1 tablet (10 mg total) by mouth daily.   Nicotine 21-14-7 MG/24HR Kit Place 1 patch onto the skin daily.   Oxycodone HCl 20 MG Tabs Take 1 tablet (20 mg total) by mouth every 4 (four) hours as needed.   pregabalin 200 MG capsule Commonly known as:  LYRICA Take 1 capsule (200 mg total) by mouth 3 (three) times  daily.   risperidone 4 MG tablet Commonly known as:  RISPERDAL Take 1 tablet (4 mg total) by mouth at bedtime.   rizatriptan 10 MG disintegrating tablet Commonly known as:  MAXALT-MLT Take 1 tablet (10 mg total) by mouth as needed for migraine. May repeat in 2 hours if needed   SUMAtriptan 100 MG tablet Commonly known as:  IMITREX Take 1 tablet (100 mg total) by mouth every 2 (two) hours as needed for migraine.   venlafaxine XR 150 MG 24 hr capsule Commonly known as:  Effexor XR  Take 1 capsule (150 mg total) by mouth daily with breakfast.       History (reviewed): Past Medical History:  Diagnosis Date  . Anxiety   . Elevated WBCs   . Migraines    History reviewed. No pertinent surgical history. Family History  Problem Relation Age of Onset  . Hypertension Mother   . Heart failure Mother   . Hyperlipidemia Mother   . Hypertension Father   . Heart failure Father   . Hyperlipidemia Father   . Cancer Sister    Social History   Socioeconomic History  . Marital status: Married    Spouse name: Not on file  . Number of children: 2  . Years of education: Not on file  . Highest education level: 12th grade  Occupational History  . Occupation: Disability   Social Needs  . Financial resource strain: Not hard at all  . Food insecurity:    Worry: Never true    Inability: Never true  . Transportation needs:    Medical: No    Non-medical: No  Tobacco Use  . Smoking status: Current Every Day Smoker    Packs/day: 1.00    Years: 15.00    Pack years: 15.00    Types: Cigarettes  . Smokeless tobacco: Never Used  Substance and Sexual Activity  . Alcohol use: No  . Drug use: No  . Sexual activity: Yes    Birth control/protection: Pill, None  Lifestyle  . Physical activity:    Days per week: 0 days    Minutes per session: 0 min  . Stress: Not at all  Relationships  . Social connections:    Talks on phone: More than three times a week    Gets together: More than three times a week    Attends religious service: Never    Active member of club or organization: No    Attends meetings of clubs or organizations: Never    Relationship status: Married  Other Topics Concern  . Not on file  Social History Narrative  . Not on file    Activities of Daily Living In your present state of health, do you have any difficulty performing the following activities: 03/23/2019  Hearing? N  Vision? N  Difficulty concentrating or making decisions? N  Walking or  climbing stairs? N  Dressing or bathing? N  Doing errands, shopping? N  Preparing Food and eating ? N  Using the Toilet? N  In the past six months, have you accidently leaked urine? N  Do you have problems with loss of bowel control? N  Managing your Medications? N  Managing your Finances? N  Housekeeping or managing your Housekeeping? N  Some recent data might be hidden    Patient Literacy How often do you need to have someone help you when you read instructions, pamphlets, or other written materials from your doctor or pharmacy?: 1 - Never  Exercise Current Exercise  Habits: The patient does not participate in regular exercise at present, Exercise limited by: orthopedic condition(s);neurologic condition(s)  Diet Patient reports consuming 2 meals a day and 0 snack(s) a day Patient reports that her primary diet is: Regular Patient reports that she does have regular access to food.   Depression Screen PHQ 2/9 Scores 03/23/2019 03/17/2019 11/23/2018 09/22/2018 08/24/2018 05/26/2018 03/10/2018  PHQ - 2 Score '2 2 2 6 3 4 4  ' PHQ- 9 Score '8 8 9 21 13 7 7     ' Fall Risk Fall Risk  03/23/2019 02/28/2017  Falls in the past year? 1 No  Number falls in past yr: 0 -     Objective:  Nancy Burnett seemed alert and oriented and she participated appropriately during our telephone visit.  Blood Pressure Weight BMI  BP Readings from Last 3 Encounters:  03/17/19 120/83  11/23/18 114/75  09/22/18 (!) 157/79   Wt Readings from Last 3 Encounters:  03/17/19 159 lb 12.8 oz (72.5 kg)  11/23/18 160 lb 3.2 oz (72.7 kg)  09/22/18 157 lb 9.6 oz (71.5 kg)   BMI Readings from Last 1 Encounters:  03/17/19 25.03 kg/m    *Unable to obtain current vital signs, weight, and BMI due to telephone visit type  Hearing/Vision  . Jamyah did not seem to have difficulty with hearing/understanding during the telephone conversation . Reports that she has not had a formal eye exam by an eye care professional within the  past year . Reports that she has not had a formal hearing evaluation within the past year *Unable to fully assess hearing and vision during telephone visit type  Cognitive Function: 6CIT Screen 03/23/2019  What Year? 0 points  What month? 0 points  What time? 0 points  Count back from 20 0 points  Months in reverse 0 points  Repeat phrase 0 points  Total Score 0    Normal Cognitive Function Screening: Yes (Normal:0-7, Significant for Dysfunction: >8)  Immunization & Health Maintenance Record Immunization History  Administered Date(s) Administered  . Influenza,inj,Quad PF,6+ Mos 08/24/2018    Health Maintenance  Topic Date Due  . HIV Screening  03/11/1987  . TETANUS/TDAP  03/11/1991  . PAP SMEAR-Modifier  03/10/1993  . INFLUENZA VACCINE  05/22/2019       Assessment  This is a routine wellness examination for [employer].  Health Maintenance: Due or Overdue Health Maintenance Due  Topic Date Due  . HIV Screening  03/11/1987  . TETANUS/TDAP  03/11/1991  . PAP SMEAR-Modifier  03/10/1993    Nancy Burnett does not need a referral for Community Assistance: Care Management:   no Social Work:    no Prescription Assistance:  no Nutrition/Diabetes Education:  no   Plan:  Personalized Goals Goals Addressed            This Visit's Progress   . Exercise 3x per week (30 min per time)        Personalized Health Maintenance & Screening Recommendations  Td vaccine Screening Pap smear and pelvic exam   Lung Cancer Screening Recommended: no (Low Dose CT Chest recommended if Age 56-80 years, 30 pack-year currently smoking OR have quit w/in past 15 years) Hepatitis C Screening recommended: yes HIV Screening recommended: yes  Advanced Directives: Written information was not prepared per patient's request.  Referrals & Orders No orders of the defined types were placed in this encounter.   Follow-up Plan . Follow-up with Nancy Sleeper, PA-C as planned  I have  personally reviewed and noted the following in the patient's chart:   . Medical and social history . Use of alcohol, tobacco or illicit drugs  . Current medications and supplements . Functional ability and status . Nutritional status . Physical activity . Advanced directives . List of other physicians . Hospitalizations, surgeries, and ER visits in previous 12 months . Vitals . Screenings to include cognitive, depression, and falls . Referrals and appointments  In addition, I have reviewed and discussed with Nancy Burnett certain preventive protocols, quality metrics, and best practice recommendations. A written personalized care plan for preventive services as well as general preventive health recommendations is available and can be mailed to the patient at her request.      Wardell Heath  03/23/2019

## 2019-03-25 ENCOUNTER — Telehealth: Payer: Self-pay | Admitting: Physician Assistant

## 2019-03-25 NOTE — Telephone Encounter (Signed)
LMTCB - jhb  Aware wendy and AJ off today

## 2019-03-25 NOTE — Telephone Encounter (Signed)
PT is wanting to speak [name] nurse about her pain medicine and restoration of [name]

## 2019-03-31 ENCOUNTER — Encounter: Payer: Self-pay | Admitting: Physician Assistant

## 2019-03-31 ENCOUNTER — Other Ambulatory Visit: Payer: Self-pay | Admitting: Physician Assistant

## 2019-04-02 ENCOUNTER — Encounter: Payer: Self-pay | Admitting: Physician Assistant

## 2019-04-05 ENCOUNTER — Telehealth: Payer: Self-pay | Admitting: Physician Assistant

## 2019-04-08 ENCOUNTER — Telehealth: Payer: Self-pay | Admitting: Physician Assistant

## 2019-04-14 NOTE — Telephone Encounter (Signed)
Left message to call back  

## 2019-04-15 DIAGNOSIS — M129 Arthropathy, unspecified: Secondary | ICD-10-CM | POA: Diagnosis not present

## 2019-04-15 DIAGNOSIS — R5383 Other fatigue: Secondary | ICD-10-CM | POA: Diagnosis not present

## 2019-04-15 DIAGNOSIS — Z79899 Other long term (current) drug therapy: Secondary | ICD-10-CM | POA: Diagnosis not present

## 2019-04-15 DIAGNOSIS — E559 Vitamin D deficiency, unspecified: Secondary | ICD-10-CM | POA: Diagnosis not present

## 2019-04-15 DIAGNOSIS — Z0000 Encounter for general adult medical examination without abnormal findings: Secondary | ICD-10-CM | POA: Diagnosis not present

## 2019-04-15 DIAGNOSIS — E7800 Pure hypercholesterolemia, unspecified: Secondary | ICD-10-CM | POA: Diagnosis not present

## 2019-04-15 DIAGNOSIS — Z131 Encounter for screening for diabetes mellitus: Secondary | ICD-10-CM | POA: Diagnosis not present

## 2019-04-16 ENCOUNTER — Other Ambulatory Visit: Payer: Self-pay | Admitting: Physician Assistant

## 2019-04-16 DIAGNOSIS — S83282A Other tear of lateral meniscus, current injury, left knee, initial encounter: Secondary | ICD-10-CM

## 2019-04-16 DIAGNOSIS — M5136 Other intervertebral disc degeneration, lumbar region: Secondary | ICD-10-CM

## 2019-04-16 DIAGNOSIS — G44029 Chronic cluster headache, not intractable: Secondary | ICD-10-CM

## 2019-06-01 DIAGNOSIS — R1013 Epigastric pain: Secondary | ICD-10-CM | POA: Diagnosis not present

## 2019-06-01 DIAGNOSIS — R109 Unspecified abdominal pain: Secondary | ICD-10-CM | POA: Diagnosis not present

## 2019-06-01 DIAGNOSIS — S299XXA Unspecified injury of thorax, initial encounter: Secondary | ICD-10-CM | POA: Diagnosis not present

## 2019-06-01 DIAGNOSIS — R0781 Pleurodynia: Secondary | ICD-10-CM | POA: Diagnosis not present

## 2019-06-01 DIAGNOSIS — M797 Fibromyalgia: Secondary | ICD-10-CM | POA: Diagnosis not present

## 2019-06-01 DIAGNOSIS — I10 Essential (primary) hypertension: Secondary | ICD-10-CM | POA: Diagnosis not present

## 2019-06-01 DIAGNOSIS — W010XXA Fall on same level from slipping, tripping and stumbling without subsequent striking against object, initial encounter: Secondary | ICD-10-CM | POA: Diagnosis not present

## 2019-08-31 ENCOUNTER — Other Ambulatory Visit: Payer: Self-pay | Admitting: Physician Assistant

## 2019-08-31 DIAGNOSIS — M797 Fibromyalgia: Secondary | ICD-10-CM

## 2019-10-11 DIAGNOSIS — G8929 Other chronic pain: Secondary | ICD-10-CM | POA: Diagnosis not present

## 2020-01-03 DIAGNOSIS — G8929 Other chronic pain: Secondary | ICD-10-CM | POA: Diagnosis not present

## 2020-02-21 DIAGNOSIS — G8929 Other chronic pain: Secondary | ICD-10-CM | POA: Diagnosis not present

## 2020-05-17 ENCOUNTER — Encounter (HOSPITAL_COMMUNITY): Payer: Self-pay

## 2020-05-17 ENCOUNTER — Emergency Department (HOSPITAL_COMMUNITY)
Admission: EM | Admit: 2020-05-17 | Discharge: 2020-05-18 | Disposition: A | Discharge status: Home / Self Care | Payer: Medicare Other | Attending: Emergency Medicine | Admitting: Emergency Medicine

## 2020-05-17 ENCOUNTER — Other Ambulatory Visit: Payer: Self-pay

## 2020-05-17 DIAGNOSIS — W540XXA Bitten by dog, initial encounter: Secondary | ICD-10-CM | POA: Diagnosis not present

## 2020-05-17 DIAGNOSIS — Z5321 Procedure and treatment not carried out due to patient leaving prior to being seen by health care provider: Secondary | ICD-10-CM | POA: Insufficient documentation

## 2020-05-17 DIAGNOSIS — Y92410 Unspecified street and highway as the place of occurrence of the external cause: Secondary | ICD-10-CM | POA: Diagnosis not present

## 2020-05-17 DIAGNOSIS — Y9389 Activity, other specified: Secondary | ICD-10-CM | POA: Diagnosis not present

## 2020-05-17 DIAGNOSIS — S51811A Laceration without foreign body of right forearm, initial encounter: Secondary | ICD-10-CM | POA: Diagnosis not present

## 2020-05-17 DIAGNOSIS — Y999 Unspecified external cause status: Secondary | ICD-10-CM | POA: Diagnosis not present

## 2020-05-17 NOTE — ED Notes (Signed)
[facility] called and informed of animal bite.

## 2020-05-17 NOTE — ED Notes (Signed)
Called no answer

## 2020-05-17 NOTE — ED Triage Notes (Addendum)
Pt states she was walking her dog and another dog attacked her dog 2 nights ago. Pt states she tried to breaking them up and has 2 small lacerations to right forearm, redness noted. Pt states happened on [address].

## 2020-05-22 DIAGNOSIS — G8929 Other chronic pain: Secondary | ICD-10-CM | POA: Diagnosis not present

## 2020-09-11 DIAGNOSIS — G8929 Other chronic pain: Secondary | ICD-10-CM | POA: Diagnosis not present

## 2020-12-03 ENCOUNTER — Ambulatory Visit
Admission: EM | Admit: 2020-12-03 | Discharge: 2020-12-03 | Disposition: A | Discharge status: Home / Self Care | Payer: Medicare Other | Attending: Emergency Medicine | Admitting: Emergency Medicine

## 2020-12-03 ENCOUNTER — Encounter: Payer: Self-pay | Admitting: Emergency Medicine

## 2020-12-03 DIAGNOSIS — Z202 Contact with and (suspected) exposure to infections with a predominantly sexual mode of transmission: Secondary | ICD-10-CM | POA: Insufficient documentation

## 2020-12-03 DIAGNOSIS — K047 Periapical abscess without sinus: Secondary | ICD-10-CM | POA: Insufficient documentation

## 2020-12-03 DIAGNOSIS — K0889 Other specified disorders of teeth and supporting structures: Secondary | ICD-10-CM | POA: Insufficient documentation

## 2020-12-03 DIAGNOSIS — R220 Localized swelling, mass and lump, head: Secondary | ICD-10-CM | POA: Insufficient documentation

## 2020-12-03 MED ORDER — CLINDAMYCIN HCL 300 MG PO CAPS: 300.0000 mg | ORAL_CAPSULE | Freq: Three times a day (TID) | ORAL | 0 refills | Status: AC

## 2020-12-03 MED ORDER — CHLORHEXIDINE GLUCONATE 0.12 % MT SOLN: 15.0000 mL | Freq: Two times a day (BID) | OROMUCOSAL | 0 refills | Status: DC

## 2020-12-03 MED ORDER — NAPROXEN 500 MG PO TABS
500.0000 mg | ORAL_TABLET | Freq: Two times a day (BID) | ORAL | 0 refills | Status: DC
Start: 2020-12-03 — End: 2023-10-09

## 2020-12-03 NOTE — Discharge Instructions (Addendum)
STD screen was completed.  Someone will call if your result is abnormal.  Chlorhexidine mouthwash and clindamycin were prescribed.  Use as directed for pain relief Recommend soft diet until evaluated by dentist Maintain oral hygiene care Follow up with dentist as soon as possible for further evaluation and treatment  Return or go to the ED if you have any new or worsening symptoms such as fever, chills, difficulty swallowing, painful swallowing, oral or neck swelling, nausea, vomiting, chest pain, SOB, etc..Marland Kitchen

## 2020-12-03 NOTE — ED Triage Notes (Signed)
Swelling to RT cheek that started last night.  Pt does have some concern for std.

## 2020-12-03 NOTE — ED Provider Notes (Signed)
[place]   [id number] 12/03/20 Arrival Time: 4098  CC: DENTAL PAIN  SUBJECTIVE:  Nancy Burnett is a 50 y.o. female presented to the urgent care for complaint of right cheek swelling that started last night.  Localized pain to the right cheek and ear.  Has not tried any OTC analgesic.  Worse with chewing.  Does not see a dentist regularly.  Denies similar symptoms in the past.  Denies fever, chills, dysphagia, odynophagia, oral or neck swelling, nausea, vomiting, chest pain, SOB.    She would also like to have STD screening completed at this visit.  Denies any precipitating event.  Denies any vaginal discharge.  ROS: As per HPI.  All other pertinent ROS negative.     Past Medical History:  Diagnosis Date  . Anxiety   . Elevated WBCs   . Migraines    History reviewed. No pertinent surgical history. Allergies  Allergen Reactions  . Cephalexin     "does not work"   No current facility-administered medications on file prior to encounter.   Current Outpatient Medications on File Prior to Encounter  Medication Sig Dispense Refill  . albuterol (PROAIR HFA) 108 (90 Base) MCG/ACT inhaler Inhale 2 puffs into the lungs every 6 (six) hours as needed for wheezing or shortness of breath. 3 Inhaler 1  . ALPRAZolam (XANAX) 0.5 MG tablet TAKE (1) TABLET BY MOUTH TWICE DAILY AS NEEDED FOR ANXIETY. 60 tablet 5  . amphetamine-dextroamphetamine (ADDERALL) 20 MG tablet Take 1 tablet (20 mg total) by mouth 2 (two) times daily. 60 tablet 0  . amphetamine-dextroamphetamine (ADDERALL) 20 MG tablet Take 1 tablet (20 mg total) by mouth 2 (two) times daily. 60 tablet 0  . amphetamine-dextroamphetamine (ADDERALL) 20 MG tablet Take 1 tablet (20 mg total) by mouth 2 (two) times daily. 60 tablet 0  . ibuprofen (ADVIL,MOTRIN) 600 MG tablet Take 1 tablet (600 mg total) by mouth every 6 (six) hours as needed. 30 tablet 0  . lisinopril (PRINIVIL,ZESTRIL) 5 MG tablet Take 1 tablet (5 mg total) by mouth  daily. 90 tablet 3  . loratadine (CLARITIN) 10 MG tablet Take 1 tablet (10 mg total) by mouth daily. 30 tablet 11  . Nicotine 21-14-7 MG/24HR KIT Place 1 patch onto the skin daily. 28 each 2  . Oxycodone HCl 20 MG TABS Take 1 tablet (20 mg total) by mouth every 4 (four) hours as needed. 150 tablet 0  . pregabalin (LYRICA) 200 MG capsule Take 1 capsule (200 mg total) by mouth 3 (three) times daily. 270 capsule 1  . risperiDONE (RISPERDAL) 4 MG tablet Take 1 tablet (4 mg total) by mouth at bedtime. 90 tablet 0  . rizatriptan (MAXALT-MLT) 10 MG disintegrating tablet Take 1 tablet (10 mg total) by mouth as needed for migraine. May repeat in 2 hours if needed 10 tablet 11  . SUMAtriptan (IMITREX) 100 MG tablet Take 1 tablet (100 mg total) by mouth every 2 (two) hours as needed for migraine. 10 tablet 5  . venlafaxine XR (EFFEXOR-XR) 150 MG 24 hr capsule Take 1 capsule (150 mg total) by mouth daily with breakfast. 90 capsule 3  . [DISCONTINUED] phentermine 37.5 MG capsule Take 1 capsule (37.5 mg total) by mouth every morning. 30 capsule 0  . [DISCONTINUED] propranolol ER (INDERAL LA) 160 MG SR capsule Take 1 capsule (160 mg total) by mouth 2 (two) times daily. 60 capsule 1  . [DISCONTINUED] topiramate (TOPAMAX) 50 MG tablet One half tab by mouth daily for  a week, then one tab by mouth daily. 30 tablet 0   Social History   Socioeconomic History  . Marital status: Married    Spouse name: Not on file  . Number of children: 2  . Years of education: Not on file  . Highest education level: 12th grade  Occupational History  . Occupation: Disability   Tobacco Use  . Smoking status: Current Every Day Smoker    Packs/day: 1.00    Years: 15.00    Pack years: 15.00    Types: Cigarettes  . Smokeless tobacco: Never Used  Vaping Use  . Vaping Use: Never used  Substance and Sexual Activity  . Alcohol use: No  . Drug use: No  . Sexual activity: Yes    Birth control/protection: Pill, None  Other Topics  Concern  . Not on file  Social History Narrative  . Not on file   Social Determinants of Health   Financial Resource Strain: Not on file  Food Insecurity: Not on file  Transportation Needs: Not on file  Physical Activity: Not on file  Stress: Not on file  Social Connections: Not on file  Intimate Partner Violence: Not on file   Family History  Problem Relation Age of Onset  . Hypertension Mother   . Heart failure Mother   . Hyperlipidemia Mother   . Hypertension Father   . Heart failure Father   . Hyperlipidemia Father   . Cancer Sister     OBJECTIVE:  Vitals:   12/03/20 0905 12/03/20 0910  BP:  (!) 143/89  Pulse:  (!) 111  Resp:  18  Temp:  98.1 F (36.7 C)  TempSrc:  Oral  SpO2:  97%  Weight: 145 lb 8.1 oz (66 kg)   Height: '5\' 6"'  (1.676 m)     Physical Exam Vitals and nursing note reviewed.  Constitutional:      General: She is not in acute distress.    Appearance: Normal appearance. She is normal weight. She is not ill-appearing, toxic-appearing or diaphoretic.  HENT:     Head: Normocephalic.     Right Ear: Tympanic membrane, ear canal and external ear normal. There is no impacted cerumen.     Left Ear: Tympanic membrane, ear canal and external ear normal. There is no impacted cerumen.     Nose: No congestion.     Mouth/Throat:     Lips: Pink.     Mouth: Mucous membranes are moist.     Dentition: Abnormal dentition. Dental caries and dental abscesses present.     Tongue: No lesions.     Pharynx: No oropharyngeal exudate.     Tonsils: No tonsillar exudate or tonsillar abscesses. 1+ on the right. 1+ on the left.   Cardiovascular:     Rate and Rhythm: Normal rate and regular rhythm.     Pulses: Normal pulses.     Heart sounds: Normal heart sounds. No murmur heard. No friction rub. No gallop.   Pulmonary:     Effort: Pulmonary effort is normal. No respiratory distress.     Breath sounds: Normal breath sounds. No stridor. No wheezing, rhonchi or rales.   Chest:     Chest wall: No tenderness.  Neurological:     Mental Status: She is alert and oriented to person, place, and time.     ASSESSMENT & PLAN:  1. Dental abscess   2. Pain, dental   3. Swelling, cheek   4. STD exposure     Meds ordered  this encounter  Medications  . chlorhexidine (PERIDEX) 0.12 % solution    Sig: Use as directed 15 mLs in the mouth or throat 2 (two) times daily.    Dispense:  120 mL    Refill:  0  . clindamycin (CLEOCIN) 300 MG capsule    Sig: Take 1 capsule (300 mg total) by mouth 3 (three) times daily for 10 days.    Dispense:  30 capsule    Refill:  0  . naproxen (NAPROSYN) 500 MG tablet    Sig: Take 1 tablet (500 mg total) by mouth 2 (two) times daily.    Dispense:  30 tablet    Refill:  0    Discharge instructions  STD screen was completed.  Someone will call if your result is abnormal.  Chlorhexidine mouthwash and clindamycin were prescribed.  Use as directed for pain relief Recommend soft diet until evaluated by dentist Maintain oral hygiene care Follow up with dentist as soon as possible for further evaluation and treatment  Return or go to the ED if you have any new or worsening symptoms such as fever, chills, difficulty swallowing, painful swallowing, oral or neck swelling, nausea, vomiting, chest pain, SOB, etc...  Reviewed expectations re: course of current medical issues. Questions answered. Outlined signs and symptoms indicating need for more acute intervention. Patient verbalized understanding. After Visit Summary given.   Emerson Monte, FNP 12/03/20 1000

## 2020-12-04 ENCOUNTER — Telehealth (HOSPITAL_COMMUNITY): Payer: Self-pay | Admitting: Emergency Medicine

## 2020-12-04 ENCOUNTER — Telehealth: Payer: Self-pay | Admitting: Emergency Medicine

## 2020-12-04 DIAGNOSIS — G8929 Other chronic pain: Secondary | ICD-10-CM | POA: Diagnosis not present

## 2020-12-04 LAB — CERVICOVAGINAL ANCILLARY ONLY
Bacterial Vaginitis (gardnerella): POSITIVE — AB
Candida Glabrata: NEGATIVE
Candida Vaginitis: NEGATIVE
Chlamydia: NEGATIVE
Comment: NEGATIVE
Comment: NEGATIVE
Comment: NEGATIVE
Comment: NEGATIVE
Comment: NEGATIVE
Comment: NORMAL
Neisseria Gonorrhea: NEGATIVE
Trichomonas: NEGATIVE

## 2020-12-04 MED ORDER — METRONIDAZOLE 500 MG PO TABS: 500.0000 mg | ORAL_TABLET | Freq: Two times a day (BID) | ORAL | 0 refills | Status: DC

## 2020-12-04 NOTE — Telephone Encounter (Signed)
attempted to call patients cell again with no answer and no option to leave voicemail.

## 2020-12-04 NOTE — Telephone Encounter (Signed)
attempted to call patients cell phone number listed in chart today at 1230 to come into office for recollect of oral sti swab.  No answer and no option to leave voicemail.  Will try again before end of shift.

## 2021-01-01 ENCOUNTER — Other Ambulatory Visit: Payer: Medicare Other | Admitting: Obstetrics & Gynecology

## 2021-02-26 DIAGNOSIS — G8929 Other chronic pain: Secondary | ICD-10-CM | POA: Diagnosis not present

## 2021-03-12 DIAGNOSIS — F17210 Nicotine dependence, cigarettes, uncomplicated: Secondary | ICD-10-CM | POA: Diagnosis not present

## 2021-03-12 DIAGNOSIS — R300 Dysuria: Secondary | ICD-10-CM | POA: Diagnosis not present

## 2021-03-12 DIAGNOSIS — G8929 Other chronic pain: Secondary | ICD-10-CM | POA: Diagnosis not present

## 2021-03-12 DIAGNOSIS — M549 Dorsalgia, unspecified: Secondary | ICD-10-CM | POA: Diagnosis not present

## 2021-03-12 DIAGNOSIS — E7800 Pure hypercholesterolemia, unspecified: Secondary | ICD-10-CM | POA: Diagnosis not present

## 2021-03-12 DIAGNOSIS — M129 Arthropathy, unspecified: Secondary | ICD-10-CM | POA: Diagnosis not present

## 2021-03-12 DIAGNOSIS — E559 Vitamin D deficiency, unspecified: Secondary | ICD-10-CM | POA: Diagnosis not present

## 2021-03-12 DIAGNOSIS — Z780 Asymptomatic menopausal state: Secondary | ICD-10-CM | POA: Diagnosis not present

## 2021-03-12 DIAGNOSIS — Z1159 Encounter for screening for other viral diseases: Secondary | ICD-10-CM | POA: Diagnosis not present

## 2021-03-12 DIAGNOSIS — D539 Nutritional anemia, unspecified: Secondary | ICD-10-CM | POA: Diagnosis not present

## 2021-03-12 DIAGNOSIS — Z0000 Encounter for general adult medical examination without abnormal findings: Secondary | ICD-10-CM | POA: Diagnosis not present

## 2021-03-12 DIAGNOSIS — Z79899 Other long term (current) drug therapy: Secondary | ICD-10-CM | POA: Diagnosis not present

## 2021-03-26 DIAGNOSIS — G8929 Other chronic pain: Secondary | ICD-10-CM | POA: Diagnosis not present

## 2021-04-24 DIAGNOSIS — M5136 Other intervertebral disc degeneration, lumbar region: Secondary | ICD-10-CM | POA: Diagnosis not present

## 2021-04-24 DIAGNOSIS — M79643 Pain in unspecified hand: Secondary | ICD-10-CM | POA: Diagnosis not present

## 2021-04-24 DIAGNOSIS — Z79899 Other long term (current) drug therapy: Secondary | ICD-10-CM | POA: Diagnosis not present

## 2021-04-24 DIAGNOSIS — M549 Dorsalgia, unspecified: Secondary | ICD-10-CM | POA: Diagnosis not present

## 2021-04-24 DIAGNOSIS — G8929 Other chronic pain: Secondary | ICD-10-CM | POA: Diagnosis not present

## 2021-04-27 DIAGNOSIS — Z79899 Other long term (current) drug therapy: Secondary | ICD-10-CM | POA: Diagnosis not present

## 2021-05-21 DIAGNOSIS — M79643 Pain in unspecified hand: Secondary | ICD-10-CM | POA: Diagnosis not present

## 2021-05-21 DIAGNOSIS — Z79899 Other long term (current) drug therapy: Secondary | ICD-10-CM | POA: Diagnosis not present

## 2021-05-21 DIAGNOSIS — M539 Dorsopathy, unspecified: Secondary | ICD-10-CM | POA: Diagnosis not present

## 2021-05-21 DIAGNOSIS — M5136 Other intervertebral disc degeneration, lumbar region: Secondary | ICD-10-CM | POA: Diagnosis not present

## 2021-05-21 DIAGNOSIS — M47814 Spondylosis without myelopathy or radiculopathy, thoracic region: Secondary | ICD-10-CM | POA: Diagnosis not present

## 2021-06-18 DIAGNOSIS — R509 Fever, unspecified: Secondary | ICD-10-CM | POA: Diagnosis not present

## 2021-06-18 DIAGNOSIS — J069 Acute upper respiratory infection, unspecified: Secondary | ICD-10-CM | POA: Diagnosis not present

## 2021-06-19 DIAGNOSIS — Z79899 Other long term (current) drug therapy: Secondary | ICD-10-CM | POA: Diagnosis not present

## 2021-06-19 DIAGNOSIS — M5136 Other intervertebral disc degeneration, lumbar region: Secondary | ICD-10-CM | POA: Diagnosis not present

## 2021-06-19 DIAGNOSIS — M419 Scoliosis, unspecified: Secondary | ICD-10-CM | POA: Diagnosis not present

## 2021-06-19 DIAGNOSIS — M549 Dorsalgia, unspecified: Secondary | ICD-10-CM | POA: Diagnosis not present

## 2021-07-18 DIAGNOSIS — Z79899 Other long term (current) drug therapy: Secondary | ICD-10-CM | POA: Diagnosis not present

## 2021-07-18 DIAGNOSIS — M5440 Lumbago with sciatica, unspecified side: Secondary | ICD-10-CM | POA: Diagnosis not present

## 2021-07-18 DIAGNOSIS — Z23 Encounter for immunization: Secondary | ICD-10-CM | POA: Diagnosis not present

## 2021-07-18 DIAGNOSIS — M5136 Other intervertebral disc degeneration, lumbar region: Secondary | ICD-10-CM | POA: Diagnosis not present

## 2021-07-18 DIAGNOSIS — M47814 Spondylosis without myelopathy or radiculopathy, thoracic region: Secondary | ICD-10-CM | POA: Diagnosis not present

## 2021-08-14 DIAGNOSIS — M5440 Lumbago with sciatica, unspecified side: Secondary | ICD-10-CM | POA: Diagnosis not present

## 2021-08-14 DIAGNOSIS — M5136 Other intervertebral disc degeneration, lumbar region: Secondary | ICD-10-CM | POA: Diagnosis not present

## 2021-08-14 DIAGNOSIS — M546 Pain in thoracic spine: Secondary | ICD-10-CM | POA: Diagnosis not present

## 2021-08-14 DIAGNOSIS — Z79899 Other long term (current) drug therapy: Secondary | ICD-10-CM | POA: Diagnosis not present

## 2021-08-14 DIAGNOSIS — M47814 Spondylosis without myelopathy or radiculopathy, thoracic region: Secondary | ICD-10-CM | POA: Diagnosis not present

## 2021-09-10 DIAGNOSIS — M5440 Lumbago with sciatica, unspecified side: Secondary | ICD-10-CM | POA: Diagnosis not present

## 2021-09-10 DIAGNOSIS — M25569 Pain in unspecified knee: Secondary | ICD-10-CM | POA: Diagnosis not present

## 2021-09-10 DIAGNOSIS — Z79891 Long term (current) use of opiate analgesic: Secondary | ICD-10-CM | POA: Diagnosis not present

## 2021-09-10 DIAGNOSIS — M5136 Other intervertebral disc degeneration, lumbar region: Secondary | ICD-10-CM | POA: Diagnosis not present

## 2021-09-10 DIAGNOSIS — Z79899 Other long term (current) drug therapy: Secondary | ICD-10-CM | POA: Diagnosis not present

## 2021-09-14 DIAGNOSIS — Z79899 Other long term (current) drug therapy: Secondary | ICD-10-CM | POA: Diagnosis not present

## 2021-10-11 DIAGNOSIS — Z79899 Other long term (current) drug therapy: Secondary | ICD-10-CM | POA: Diagnosis not present

## 2021-10-11 DIAGNOSIS — M5440 Lumbago with sciatica, unspecified side: Secondary | ICD-10-CM | POA: Diagnosis not present

## 2021-10-11 DIAGNOSIS — M25569 Pain in unspecified knee: Secondary | ICD-10-CM | POA: Diagnosis not present

## 2021-10-11 DIAGNOSIS — G8929 Other chronic pain: Secondary | ICD-10-CM | POA: Diagnosis not present

## 2021-10-11 DIAGNOSIS — M5136 Other intervertebral disc degeneration, lumbar region: Secondary | ICD-10-CM | POA: Diagnosis not present

## 2021-10-16 DIAGNOSIS — Z79899 Other long term (current) drug therapy: Secondary | ICD-10-CM | POA: Diagnosis not present

## 2021-11-09 DIAGNOSIS — E7800 Pure hypercholesterolemia, unspecified: Secondary | ICD-10-CM | POA: Diagnosis not present

## 2021-11-09 DIAGNOSIS — Z79899 Other long term (current) drug therapy: Secondary | ICD-10-CM | POA: Diagnosis not present

## 2021-11-09 DIAGNOSIS — R5383 Other fatigue: Secondary | ICD-10-CM | POA: Diagnosis not present

## 2021-11-09 DIAGNOSIS — M5136 Other intervertebral disc degeneration, lumbar region: Secondary | ICD-10-CM | POA: Diagnosis not present

## 2021-11-09 DIAGNOSIS — Z0000 Encounter for general adult medical examination without abnormal findings: Secondary | ICD-10-CM | POA: Diagnosis not present

## 2021-11-09 DIAGNOSIS — M129 Arthropathy, unspecified: Secondary | ICD-10-CM | POA: Diagnosis not present

## 2021-11-09 DIAGNOSIS — D539 Nutritional anemia, unspecified: Secondary | ICD-10-CM | POA: Diagnosis not present

## 2021-11-09 DIAGNOSIS — R300 Dysuria: Secondary | ICD-10-CM | POA: Diagnosis not present

## 2021-11-09 DIAGNOSIS — E559 Vitamin D deficiency, unspecified: Secondary | ICD-10-CM | POA: Diagnosis not present

## 2021-11-09 DIAGNOSIS — F17210 Nicotine dependence, cigarettes, uncomplicated: Secondary | ICD-10-CM | POA: Diagnosis not present

## 2021-11-09 DIAGNOSIS — G8929 Other chronic pain: Secondary | ICD-10-CM | POA: Diagnosis not present

## 2021-11-09 DIAGNOSIS — Z20822 Contact with and (suspected) exposure to covid-19: Secondary | ICD-10-CM | POA: Diagnosis not present

## 2021-11-09 DIAGNOSIS — M25569 Pain in unspecified knee: Secondary | ICD-10-CM | POA: Diagnosis not present

## 2021-11-09 DIAGNOSIS — M5440 Lumbago with sciatica, unspecified side: Secondary | ICD-10-CM | POA: Diagnosis not present

## 2021-11-13 DIAGNOSIS — Z79899 Other long term (current) drug therapy: Secondary | ICD-10-CM | POA: Diagnosis not present

## 2021-12-07 DIAGNOSIS — M5440 Lumbago with sciatica, unspecified side: Secondary | ICD-10-CM | POA: Diagnosis not present

## 2021-12-07 DIAGNOSIS — M5136 Other intervertebral disc degeneration, lumbar region: Secondary | ICD-10-CM | POA: Diagnosis not present

## 2021-12-07 DIAGNOSIS — M25561 Pain in right knee: Secondary | ICD-10-CM | POA: Diagnosis not present

## 2021-12-07 DIAGNOSIS — Z79899 Other long term (current) drug therapy: Secondary | ICD-10-CM | POA: Diagnosis not present

## 2022-01-03 DIAGNOSIS — M5440 Lumbago with sciatica, unspecified side: Secondary | ICD-10-CM | POA: Diagnosis not present

## 2022-01-03 DIAGNOSIS — M546 Pain in thoracic spine: Secondary | ICD-10-CM | POA: Diagnosis not present

## 2022-01-03 DIAGNOSIS — Z79899 Other long term (current) drug therapy: Secondary | ICD-10-CM | POA: Diagnosis not present

## 2022-01-03 DIAGNOSIS — M5134 Other intervertebral disc degeneration, thoracic region: Secondary | ICD-10-CM | POA: Diagnosis not present

## 2022-01-03 DIAGNOSIS — M5136 Other intervertebral disc degeneration, lumbar region: Secondary | ICD-10-CM | POA: Diagnosis not present

## 2022-01-07 DIAGNOSIS — Z79899 Other long term (current) drug therapy: Secondary | ICD-10-CM | POA: Diagnosis not present

## 2022-01-23 ENCOUNTER — Emergency Department (HOSPITAL_COMMUNITY): Payer: Medicare Other

## 2022-01-23 ENCOUNTER — Emergency Department (HOSPITAL_COMMUNITY)
Admission: EM | Admit: 2022-01-23 | Discharge: 2022-01-23 | Disposition: A | Discharge status: Home / Self Care | Payer: Medicare Other | Attending: Emergency Medicine | Admitting: Emergency Medicine

## 2022-01-23 ENCOUNTER — Encounter (HOSPITAL_COMMUNITY): Payer: Self-pay

## 2022-01-23 ENCOUNTER — Other Ambulatory Visit: Payer: Self-pay

## 2022-01-23 DIAGNOSIS — Z79899 Other long term (current) drug therapy: Secondary | ICD-10-CM | POA: Insufficient documentation

## 2022-01-23 DIAGNOSIS — R110 Nausea: Secondary | ICD-10-CM | POA: Diagnosis not present

## 2022-01-23 DIAGNOSIS — D582 Other hemoglobinopathies: Secondary | ICD-10-CM | POA: Insufficient documentation

## 2022-01-23 DIAGNOSIS — I10 Essential (primary) hypertension: Secondary | ICD-10-CM | POA: Insufficient documentation

## 2022-01-23 DIAGNOSIS — R42 Dizziness and giddiness: Secondary | ICD-10-CM | POA: Diagnosis not present

## 2022-01-23 DIAGNOSIS — M542 Cervicalgia: Secondary | ICD-10-CM | POA: Diagnosis not present

## 2022-01-23 DIAGNOSIS — X500XXA Overexertion from strenuous movement or load, initial encounter: Secondary | ICD-10-CM | POA: Diagnosis not present

## 2022-01-23 DIAGNOSIS — D72829 Elevated white blood cell count, unspecified: Secondary | ICD-10-CM | POA: Insufficient documentation

## 2022-01-23 DIAGNOSIS — R0789 Other chest pain: Secondary | ICD-10-CM | POA: Diagnosis not present

## 2022-01-23 DIAGNOSIS — R079 Chest pain, unspecified: Secondary | ICD-10-CM | POA: Diagnosis not present

## 2022-01-23 LAB — CBC
HCT: 45 % (ref 36.0–46.0)
Hemoglobin: 15.4 g/dL — ABNORMAL HIGH (ref 12.0–15.0)
MCH: 32 pg (ref 26.0–34.0)
MCHC: 34.2 g/dL (ref 30.0–36.0)
MCV: 93.4 fL (ref 80.0–100.0)
Platelets: 287 10*3/uL (ref 150–400)
RBC: 4.82 MIL/uL (ref 3.87–5.11)
RDW: 13.6 % (ref 11.5–15.5)
WBC: 11.6 10*3/uL — ABNORMAL HIGH (ref 4.0–10.5)
nRBC: 0 % (ref 0.0–0.2)

## 2022-01-23 LAB — CBG MONITORING, ED: Glucose-Capillary: 102 mg/dL — ABNORMAL HIGH (ref 70–99)

## 2022-01-23 LAB — BASIC METABOLIC PANEL
Anion gap: 8 (ref 5–15)
BUN: 23 mg/dL — ABNORMAL HIGH (ref 6–20)
CO2: 25 mmol/L (ref 22–32)
Calcium: 8.9 mg/dL (ref 8.9–10.3)
Chloride: 104 mmol/L (ref 98–111)
Creatinine, Ser: 0.58 mg/dL (ref 0.44–1.00)
GFR, Estimated: >60 mL/min (ref >60)
Glucose, Bld: 103 mg/dL — ABNORMAL HIGH (ref 70–99)
Potassium: 3.4 mmol/L — ABNORMAL LOW (ref 3.5–5.1)
Sodium: 137 mmol/L (ref 135–145)

## 2022-01-23 LAB — TROPONIN I (HIGH SENSITIVITY)
Troponin I (High Sensitivity): <2 ng/L (ref <18)
Troponin I (High Sensitivity): <2 ng/L (ref <18)

## 2022-01-23 MED ORDER — LORAZEPAM 2 MG/ML IJ SOLN
0.5000 mg | Freq: Once | INTRAMUSCULAR | Status: AC
  Administered 2022-01-23: 0.5 mg via INTRAVENOUS
  Filled 2022-01-23: qty 1

## 2022-01-23 MED ORDER — HYDROMORPHONE HCL 1 MG/ML IJ SOLN
0.5000 mg | Freq: Once | INTRAMUSCULAR | Status: AC
  Administered 2022-01-23: 0.5 mg via INTRAVENOUS
  Filled 2022-01-23: qty 1

## 2022-01-23 MED ORDER — HYDROCODONE-ACETAMINOPHEN 5-325 MG PO TABS: 1.0000 | ORAL_TABLET | Freq: Four times a day (QID) | ORAL | 0 refills | Status: DC | PRN

## 2022-01-23 NOTE — ED Triage Notes (Signed)
Patient complaining of chest pain, nausea, and dizziness that started the previous day.  ?

## 2022-01-23 NOTE — ED Notes (Signed)
Pt to CT at this time.

## 2022-01-23 NOTE — ED Provider Notes (Signed)
?[facility] ?Provider Note ? ? ?CSN: [id number] ?Arrival date & time: 01/23/22  1039 ? ?  ? ?History ? ?Chief Complaint  ?Patient presents with  ? Chest Pain  ? ? ?Nancy Burnett is a 51 y.o. female. ? ?Patient states that she has some chest discomfort this week.  After lifting a very heavy dog.  Past medical history hypertension ? ?The history is provided by the patient and medical records. No language interpreter was used.  ?Chest Pain ?Pain location:  L chest ?Pain quality: aching   ?Pain radiates to:  Does not radiate ?Pain severity:  Moderate ?Onset quality:  Sudden ?Timing:  Constant ?Progression:  Waxing and waning ?Chronicity:  New ?Context: not breathing   ?Worsened by:  Nothing ?Ineffective treatments:  None tried ?Associated symptoms: no abdominal pain, no back pain, no cough, no fatigue and no headache   ? ?  ? ?Home Medications ?Prior to Admission medications   ?Medication Sig Start Date End Date Taking? Authorizing Provider  ?HYDROcodone-acetaminophen (NORCO/VICODIN) 5-325 MG tablet Take 1 tablet by mouth every 6 (six) hours as needed. 01/23/22  Yes Milton Ferguson, MD  ?albuterol (PROAIR HFA) 108 (90 Base) MCG/ACT inhaler Inhale 2 puffs into the lungs every 6 (six) hours as needed for wheezing or shortness of breath. 02/16/19   Terald Sleeper, PA-C  ?ALPRAZolam (XANAX) 0.5 MG tablet TAKE (1) TABLET BY MOUTH TWICE DAILY AS NEEDED FOR ANXIETY. 03/17/19   Terald Sleeper, PA-C  ?amphetamine-dextroamphetamine (ADDERALL) 20 MG tablet Take 1 tablet (20 mg total) by mouth 2 (two) times daily. 02/16/19   Terald Sleeper, PA-C  ?amphetamine-dextroamphetamine (ADDERALL) 20 MG tablet Take 1 tablet (20 mg total) by mouth 2 (two) times daily. 02/16/19   Terald Sleeper, PA-C  ?amphetamine-dextroamphetamine (ADDERALL) 20 MG tablet Take 1 tablet (20 mg total) by mouth 2 (two) times daily. 02/16/19   Terald Sleeper, PA-C  ?chlorhexidine (PERIDEX) 0.12 % solution Use as directed 15 mLs in the mouth or throat 2  (two) times daily. 12/03/20   Avegno, Darrelyn Hillock, FNP  ?ibuprofen (ADVIL,MOTRIN) 600 MG tablet Take 1 tablet (600 mg total) by mouth every 6 (six) hours as needed. 03/11/18   Terald Sleeper, PA-C  ?lisinopril (PRINIVIL,ZESTRIL) 5 MG tablet Take 1 tablet (5 mg total) by mouth daily. 05/26/18   Terald Sleeper, PA-C  ?loratadine (CLARITIN) 10 MG tablet Take 1 tablet (10 mg total) by mouth daily. 05/26/18   Terald Sleeper, PA-C  ?metroNIDAZOLE (FLAGYL) 500 MG tablet Take 1 tablet (500 mg total) by mouth 2 (two) times daily. 12/04/20   LampteyMyrene Galas, MD  ?naproxen (NAPROSYN) 500 MG tablet Take 1 tablet (500 mg total) by mouth 2 (two) times daily. 12/03/20   Emerson Monte, FNP  ?Nicotine 21-14-7 MG/24HR KIT Place 1 patch onto the skin daily. 05/26/18   Terald Sleeper, PA-C  ?Oxycodone HCl 20 MG TABS Take 1 tablet (20 mg total) by mouth every 4 (four) hours as needed. 03/17/19   Terald Sleeper, PA-C  ?pregabalin (LYRICA) 200 MG capsule Take 1 capsule (200 mg total) by mouth 3 (three) times daily. 02/16/19   Terald Sleeper, PA-C  ?risperiDONE (RISPERDAL) 4 MG tablet Take 1 tablet (4 mg total) by mouth at bedtime. 09/22/18   Terald Sleeper, PA-C  ?rizatriptan (MAXALT-MLT) 10 MG disintegrating tablet Take 1 tablet (10 mg total) by mouth as needed for migraine. May repeat in 2 hours if needed 05/26/18  Terald Sleeper, PA-C  ?SUMAtriptan (IMITREX) 100 MG tablet Take 1 tablet (100 mg total) by mouth every 2 (two) hours as needed for migraine. 02/19/19   Terald Sleeper, PA-C  ?venlafaxine XR (EFFEXOR-XR) 150 MG 24 hr capsule Take 1 capsule (150 mg total) by mouth daily with breakfast. 08/24/18   Terald Sleeper, PA-C  ?phentermine 37.5 MG capsule Take 1 capsule (37.5 mg total) by mouth every morning. 11/05/13 03/17/19  Silverio Decamp, MD  ?propranolol ER (INDERAL LA) 160 MG SR capsule Take 1 capsule (160 mg total) by mouth 2 (two) times daily. 11/05/13 03/17/19  Silverio Decamp, MD  ?topiramate (TOPAMAX) 50 MG tablet One half  tab by mouth daily for a week, then one tab by mouth daily. 11/05/13 03/17/19  Silverio Decamp, MD  ?   ? ?Allergies    ?Cephalexin   ? ?Review of Systems   ?Review of Systems  ?Constitutional:  Negative for appetite change and fatigue.  ?HENT:  Negative for congestion, ear discharge and sinus pressure.   ?Eyes:  Negative for discharge.  ?Respiratory:  Negative for cough.   ?Cardiovascular:  Positive for chest pain.  ?Gastrointestinal:  Negative for abdominal pain and diarrhea.  ?Genitourinary:  Negative for frequency and hematuria.  ?Musculoskeletal:  Negative for back pain.  ?Skin:  Negative for rash.  ?Neurological:  Negative for seizures and headaches.  ?Psychiatric/Behavioral:  Negative for hallucinations.   ? ?Physical Exam ?Updated Vital Signs ?BP 103/82   Pulse 71   Temp 97.8 ?F (36.6 ?C) (Oral)   Resp 14   Ht 5' 6" (1.676 m)   Wt 65.8 kg   SpO2 95%   BMI 23.40 kg/m?  ?Physical Exam ?Vitals and nursing note reviewed.  ?Constitutional:   ?   Appearance: She is well-developed.  ?HENT:  ?   Head: Normocephalic.  ?   Nose: Nose normal.  ?Eyes:  ?   General: No scleral icterus. ?   Conjunctiva/sclera: Conjunctivae normal.  ?Neck:  ?   Thyroid: No thyromegaly.  ?Cardiovascular:  ?   Rate and Rhythm: Normal rate and regular rhythm.  ?   Heart sounds: No murmur heard. ?  No friction rub. No gallop.  ?Pulmonary:  ?   Breath sounds: No stridor. No wheezing or rales.  ?Chest:  ?   Chest wall: Tenderness present.  ?Abdominal:  ?   General: There is no distension.  ?   Tenderness: There is no abdominal tenderness. There is no rebound.  ?Musculoskeletal:     ?   General: Normal range of motion.  ?   Cervical back: Neck supple.  ?Lymphadenopathy:  ?   Cervical: No cervical adenopathy.  ?Skin: ?   Findings: No erythema or rash.  ?Neurological:  ?   Mental Status: She is alert and oriented to person, place, and time.  ?   Motor: No abnormal muscle tone.  ?   Coordination: Coordination normal.  ?Psychiatric:     ?    Behavior: Behavior normal.  ? ? ?ED Results / Procedures / Treatments   ?Labs ?(all labs ordered are listed, but only abnormal results are displayed) ?Labs Reviewed  ?BASIC METABOLIC PANEL - Abnormal; Notable for the following components:  ?    Result Value  ? Potassium 3.4 (*)   ? Glucose, Bld 103 (*)   ? BUN 23 (*)   ? All other components within normal limits  ?CBC - Abnormal; Notable for the following components:  ?  WBC 11.6 (*)   ? Hemoglobin 15.4 (*)   ? All other components within normal limits  ?CBG MONITORING, ED - Abnormal; Notable for the following components:  ? Glucose-Capillary 102 (*)   ? All other components within normal limits  ?POC URINE PREG, ED  ?TROPONIN I (HIGH SENSITIVITY)  ?TROPONIN I (HIGH SENSITIVITY)  ? ? ?EKG ?EKG Interpretation ? ?Date/Time:  Wednesday January 23 2022 11:06:12 EDT ?Ventricular Rate:  100 ?PR Interval:  132 ?QRS Duration: 76 ?QT Interval:  348 ?QTC Calculation: 448 ?R Axis:   42 ?Text Interpretation: Normal sinus rhythm Right atrial enlargement Borderline ECG No previous ECGs available Confirmed by Milton Ferguson [phone]) on 01/23/2022 1:09:05 PM ? ?Radiology ?DG Chest 2 View ? ?Result Date: 01/23/2022 ?CLINICAL DATA:  Chest pain with nausea and dizziness. EXAM: CHEST - 2 VIEW COMPARISON:  None. FINDINGS: The heart size and mediastinal contours are within normal limits. Both lungs are clear. The visualized skeletal structures are unremarkable. IMPRESSION: No active cardiopulmonary disease. Electronically Signed   By: Jorje Guild M.D.   On: 01/23/2022 11:32  ? ?CT Head Wo Contrast ? ?Result Date: 01/23/2022 ?CLINICAL DATA:  Dizziness EXAM: CT HEAD WITHOUT CONTRAST TECHNIQUE: Contiguous axial images were obtained from the base of the skull through the vertex without intravenous contrast. RADIATION DOSE REDUCTION: This exam was performed according to the departmental dose-optimization program which includes automated exposure control, adjustment of the mA and/or kV according  to patient size and/or use of iterative reconstruction technique. COMPARISON:  None. FINDINGS: Brain: No acute intracranial findings are seen. Ventricles are not dilated. There are no signs of bleeding within

## 2022-01-23 NOTE — Discharge Instructions (Signed)
Follow-up with your family doctor.  Follow-up with Dr. Wyline Mood or one of his associates in the cardiology office in the next couple weeks ?

## 2022-02-04 DIAGNOSIS — M546 Pain in thoracic spine: Secondary | ICD-10-CM | POA: Diagnosis not present

## 2022-02-04 DIAGNOSIS — M5442 Lumbago with sciatica, left side: Secondary | ICD-10-CM | POA: Diagnosis not present

## 2022-02-04 DIAGNOSIS — M5134 Other intervertebral disc degeneration, thoracic region: Secondary | ICD-10-CM | POA: Diagnosis not present

## 2022-02-04 DIAGNOSIS — Z79899 Other long term (current) drug therapy: Secondary | ICD-10-CM | POA: Diagnosis not present

## 2022-02-04 DIAGNOSIS — G8929 Other chronic pain: Secondary | ICD-10-CM | POA: Diagnosis not present

## 2022-02-07 DIAGNOSIS — Z79899 Other long term (current) drug therapy: Secondary | ICD-10-CM | POA: Diagnosis not present

## 2022-03-04 DIAGNOSIS — M5134 Other intervertebral disc degeneration, thoracic region: Secondary | ICD-10-CM | POA: Diagnosis not present

## 2022-03-04 DIAGNOSIS — G8929 Other chronic pain: Secondary | ICD-10-CM | POA: Diagnosis not present

## 2022-03-04 DIAGNOSIS — Z79899 Other long term (current) drug therapy: Secondary | ICD-10-CM | POA: Diagnosis not present

## 2022-03-04 DIAGNOSIS — M546 Pain in thoracic spine: Secondary | ICD-10-CM | POA: Diagnosis not present

## 2022-03-08 DIAGNOSIS — Z79899 Other long term (current) drug therapy: Secondary | ICD-10-CM | POA: Diagnosis not present

## 2022-04-01 DIAGNOSIS — G8929 Other chronic pain: Secondary | ICD-10-CM | POA: Diagnosis not present

## 2022-04-01 DIAGNOSIS — M5134 Other intervertebral disc degeneration, thoracic region: Secondary | ICD-10-CM | POA: Diagnosis not present

## 2022-04-01 DIAGNOSIS — M546 Pain in thoracic spine: Secondary | ICD-10-CM | POA: Diagnosis not present

## 2022-04-01 DIAGNOSIS — Z79899 Other long term (current) drug therapy: Secondary | ICD-10-CM | POA: Diagnosis not present

## 2022-04-01 DIAGNOSIS — M5442 Lumbago with sciatica, left side: Secondary | ICD-10-CM | POA: Diagnosis not present

## 2022-04-03 DIAGNOSIS — Z1231 Encounter for screening mammogram for malignant neoplasm of breast: Secondary | ICD-10-CM | POA: Diagnosis not present

## 2022-05-02 DIAGNOSIS — M539 Dorsopathy, unspecified: Secondary | ICD-10-CM | POA: Diagnosis not present

## 2022-05-02 DIAGNOSIS — Z79899 Other long term (current) drug therapy: Secondary | ICD-10-CM | POA: Diagnosis not present

## 2022-05-02 DIAGNOSIS — M546 Pain in thoracic spine: Secondary | ICD-10-CM | POA: Diagnosis not present

## 2022-05-02 DIAGNOSIS — M5134 Other intervertebral disc degeneration, thoracic region: Secondary | ICD-10-CM | POA: Diagnosis not present

## 2022-05-06 DIAGNOSIS — Z79899 Other long term (current) drug therapy: Secondary | ICD-10-CM | POA: Diagnosis not present

## 2022-05-30 DIAGNOSIS — M546 Pain in thoracic spine: Secondary | ICD-10-CM | POA: Diagnosis not present

## 2022-05-30 DIAGNOSIS — M5136 Other intervertebral disc degeneration, lumbar region: Secondary | ICD-10-CM | POA: Diagnosis not present

## 2022-05-30 DIAGNOSIS — M5134 Other intervertebral disc degeneration, thoracic region: Secondary | ICD-10-CM | POA: Diagnosis not present

## 2022-05-30 DIAGNOSIS — M549 Dorsalgia, unspecified: Secondary | ICD-10-CM | POA: Diagnosis not present

## 2022-05-30 DIAGNOSIS — G8929 Other chronic pain: Secondary | ICD-10-CM | POA: Diagnosis not present

## 2022-05-30 DIAGNOSIS — Z79899 Other long term (current) drug therapy: Secondary | ICD-10-CM | POA: Diagnosis not present

## 2022-06-03 DIAGNOSIS — Z79899 Other long term (current) drug therapy: Secondary | ICD-10-CM | POA: Diagnosis not present

## 2022-06-26 DIAGNOSIS — M549 Dorsalgia, unspecified: Secondary | ICD-10-CM | POA: Diagnosis not present

## 2022-06-26 DIAGNOSIS — M5136 Other intervertebral disc degeneration, lumbar region: Secondary | ICD-10-CM | POA: Diagnosis not present

## 2022-06-26 DIAGNOSIS — M546 Pain in thoracic spine: Secondary | ICD-10-CM | POA: Diagnosis not present

## 2022-06-26 DIAGNOSIS — G8929 Other chronic pain: Secondary | ICD-10-CM | POA: Diagnosis not present

## 2022-06-26 DIAGNOSIS — M47814 Spondylosis without myelopathy or radiculopathy, thoracic region: Secondary | ICD-10-CM | POA: Diagnosis not present

## 2022-06-26 DIAGNOSIS — Z79899 Other long term (current) drug therapy: Secondary | ICD-10-CM | POA: Diagnosis not present

## 2022-06-28 DIAGNOSIS — Z79899 Other long term (current) drug therapy: Secondary | ICD-10-CM | POA: Diagnosis not present

## 2022-07-23 DIAGNOSIS — M542 Cervicalgia: Secondary | ICD-10-CM | POA: Diagnosis not present

## 2022-07-23 DIAGNOSIS — Z79899 Other long term (current) drug therapy: Secondary | ICD-10-CM | POA: Diagnosis not present

## 2022-07-23 DIAGNOSIS — M5136 Other intervertebral disc degeneration, lumbar region: Secondary | ICD-10-CM | POA: Diagnosis not present

## 2022-07-23 DIAGNOSIS — G8929 Other chronic pain: Secondary | ICD-10-CM | POA: Diagnosis not present

## 2022-07-23 DIAGNOSIS — M549 Dorsalgia, unspecified: Secondary | ICD-10-CM | POA: Diagnosis not present

## 2022-07-23 DIAGNOSIS — Z23 Encounter for immunization: Secondary | ICD-10-CM | POA: Diagnosis not present

## 2022-07-23 DIAGNOSIS — M5030 Other cervical disc degeneration, unspecified cervical region: Secondary | ICD-10-CM | POA: Diagnosis not present

## 2022-07-29 DIAGNOSIS — Z79899 Other long term (current) drug therapy: Secondary | ICD-10-CM | POA: Diagnosis not present

## 2022-08-22 DIAGNOSIS — M5136 Other intervertebral disc degeneration, lumbar region: Secondary | ICD-10-CM | POA: Diagnosis not present

## 2022-08-22 DIAGNOSIS — Z79899 Other long term (current) drug therapy: Secondary | ICD-10-CM | POA: Diagnosis not present

## 2022-08-22 DIAGNOSIS — M5030 Other cervical disc degeneration, unspecified cervical region: Secondary | ICD-10-CM | POA: Diagnosis not present

## 2022-08-22 DIAGNOSIS — M542 Cervicalgia: Secondary | ICD-10-CM | POA: Diagnosis not present

## 2022-08-22 DIAGNOSIS — M549 Dorsalgia, unspecified: Secondary | ICD-10-CM | POA: Diagnosis not present

## 2022-08-22 DIAGNOSIS — G8929 Other chronic pain: Secondary | ICD-10-CM | POA: Diagnosis not present

## 2022-08-22 DIAGNOSIS — M47816 Spondylosis without myelopathy or radiculopathy, lumbar region: Secondary | ICD-10-CM | POA: Diagnosis not present

## 2022-08-26 DIAGNOSIS — Z79899 Other long term (current) drug therapy: Secondary | ICD-10-CM | POA: Diagnosis not present

## 2022-09-19 DIAGNOSIS — M5136 Other intervertebral disc degeneration, lumbar region: Secondary | ICD-10-CM | POA: Diagnosis not present

## 2022-09-19 DIAGNOSIS — M47816 Spondylosis without myelopathy or radiculopathy, lumbar region: Secondary | ICD-10-CM | POA: Diagnosis not present

## 2022-09-19 DIAGNOSIS — M549 Dorsalgia, unspecified: Secondary | ICD-10-CM | POA: Diagnosis not present

## 2022-09-19 DIAGNOSIS — M542 Cervicalgia: Secondary | ICD-10-CM | POA: Diagnosis not present

## 2022-09-19 DIAGNOSIS — M5030 Other cervical disc degeneration, unspecified cervical region: Secondary | ICD-10-CM | POA: Diagnosis not present

## 2022-09-19 DIAGNOSIS — Z79899 Other long term (current) drug therapy: Secondary | ICD-10-CM | POA: Diagnosis not present

## 2022-10-22 DIAGNOSIS — G8929 Other chronic pain: Secondary | ICD-10-CM | POA: Diagnosis not present

## 2022-10-22 DIAGNOSIS — M5030 Other cervical disc degeneration, unspecified cervical region: Secondary | ICD-10-CM | POA: Diagnosis not present

## 2022-10-22 DIAGNOSIS — M5136 Other intervertebral disc degeneration, lumbar region: Secondary | ICD-10-CM | POA: Diagnosis not present

## 2022-10-22 DIAGNOSIS — Z1211 Encounter for screening for malignant neoplasm of colon: Secondary | ICD-10-CM | POA: Diagnosis not present

## 2022-10-22 DIAGNOSIS — M542 Cervicalgia: Secondary | ICD-10-CM | POA: Diagnosis not present

## 2022-10-22 DIAGNOSIS — M47816 Spondylosis without myelopathy or radiculopathy, lumbar region: Secondary | ICD-10-CM | POA: Diagnosis not present

## 2022-10-22 DIAGNOSIS — Z79899 Other long term (current) drug therapy: Secondary | ICD-10-CM | POA: Diagnosis not present

## 2022-10-22 DIAGNOSIS — M549 Dorsalgia, unspecified: Secondary | ICD-10-CM | POA: Diagnosis not present

## 2022-10-25 DIAGNOSIS — Z79899 Other long term (current) drug therapy: Secondary | ICD-10-CM | POA: Diagnosis not present

## 2022-11-20 DIAGNOSIS — M549 Dorsalgia, unspecified: Secondary | ICD-10-CM | POA: Diagnosis not present

## 2022-11-20 DIAGNOSIS — Z79899 Other long term (current) drug therapy: Secondary | ICD-10-CM | POA: Diagnosis not present

## 2022-11-20 DIAGNOSIS — G8929 Other chronic pain: Secondary | ICD-10-CM | POA: Diagnosis not present

## 2022-11-20 DIAGNOSIS — U071 COVID-19: Secondary | ICD-10-CM | POA: Diagnosis not present

## 2022-11-20 DIAGNOSIS — M5030 Other cervical disc degeneration, unspecified cervical region: Secondary | ICD-10-CM | POA: Diagnosis not present

## 2022-11-20 DIAGNOSIS — Z20822 Contact with and (suspected) exposure to covid-19: Secondary | ICD-10-CM | POA: Diagnosis not present

## 2022-11-20 DIAGNOSIS — M47816 Spondylosis without myelopathy or radiculopathy, lumbar region: Secondary | ICD-10-CM | POA: Diagnosis not present

## 2022-11-25 DIAGNOSIS — Z79899 Other long term (current) drug therapy: Secondary | ICD-10-CM | POA: Diagnosis not present

## 2022-12-18 DIAGNOSIS — M5136 Other intervertebral disc degeneration, lumbar region: Secondary | ICD-10-CM | POA: Diagnosis not present

## 2022-12-18 DIAGNOSIS — M549 Dorsalgia, unspecified: Secondary | ICD-10-CM | POA: Diagnosis not present

## 2022-12-18 DIAGNOSIS — M5030 Other cervical disc degeneration, unspecified cervical region: Secondary | ICD-10-CM | POA: Diagnosis not present

## 2022-12-18 DIAGNOSIS — Z79899 Other long term (current) drug therapy: Secondary | ICD-10-CM | POA: Diagnosis not present

## 2022-12-18 DIAGNOSIS — M47816 Spondylosis without myelopathy or radiculopathy, lumbar region: Secondary | ICD-10-CM | POA: Diagnosis not present

## 2022-12-18 DIAGNOSIS — G8929 Other chronic pain: Secondary | ICD-10-CM | POA: Diagnosis not present

## 2022-12-18 DIAGNOSIS — M542 Cervicalgia: Secondary | ICD-10-CM | POA: Diagnosis not present

## 2022-12-18 DIAGNOSIS — Z0000 Encounter for general adult medical examination without abnormal findings: Secondary | ICD-10-CM | POA: Diagnosis not present

## 2022-12-20 DIAGNOSIS — Z79899 Other long term (current) drug therapy: Secondary | ICD-10-CM | POA: Diagnosis not present

## 2023-01-20 DIAGNOSIS — M5136 Other intervertebral disc degeneration, lumbar region: Secondary | ICD-10-CM | POA: Diagnosis not present

## 2023-01-20 DIAGNOSIS — M549 Dorsalgia, unspecified: Secondary | ICD-10-CM | POA: Diagnosis not present

## 2023-01-20 DIAGNOSIS — G8929 Other chronic pain: Secondary | ICD-10-CM | POA: Diagnosis not present

## 2023-01-20 DIAGNOSIS — Z79899 Other long term (current) drug therapy: Secondary | ICD-10-CM | POA: Diagnosis not present

## 2023-01-20 DIAGNOSIS — M47816 Spondylosis without myelopathy or radiculopathy, lumbar region: Secondary | ICD-10-CM | POA: Diagnosis not present

## 2023-01-30 DIAGNOSIS — Z79899 Other long term (current) drug therapy: Secondary | ICD-10-CM | POA: Diagnosis not present

## 2023-02-04 DIAGNOSIS — Z79899 Other long term (current) drug therapy: Secondary | ICD-10-CM | POA: Diagnosis not present

## 2023-02-27 DIAGNOSIS — M25561 Pain in right knee: Secondary | ICD-10-CM | POA: Diagnosis not present

## 2023-02-27 DIAGNOSIS — Z79899 Other long term (current) drug therapy: Secondary | ICD-10-CM | POA: Diagnosis not present

## 2023-02-27 DIAGNOSIS — G8929 Other chronic pain: Secondary | ICD-10-CM | POA: Diagnosis not present

## 2023-02-27 DIAGNOSIS — M549 Dorsalgia, unspecified: Secondary | ICD-10-CM | POA: Diagnosis not present

## 2023-02-27 DIAGNOSIS — M5136 Other intervertebral disc degeneration, lumbar region: Secondary | ICD-10-CM | POA: Diagnosis not present

## 2023-04-02 IMAGING — CT CT HEAD W/O CM
4 series · 16 of 47 positions shown, 18 images · non-contrast
Comparison: None.

CLINICAL DATA: Dizziness



[Series 2: head w o · axial · 0.42mm/px · z∈[+60,+180]mm · 7 of 32 slices shown, 9 images]
[im 4/32  brain]
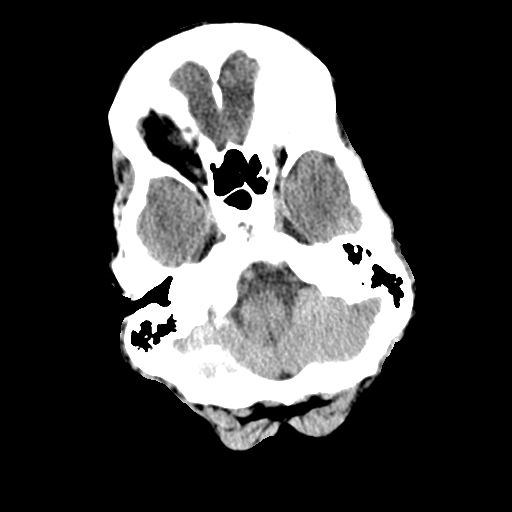
[im 4/32  bone]
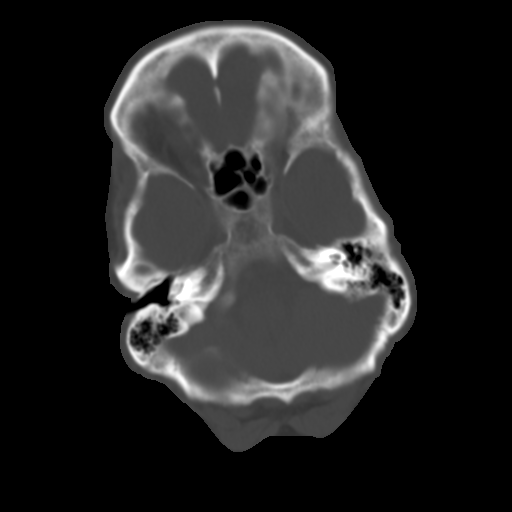
[im 8/32  brain]
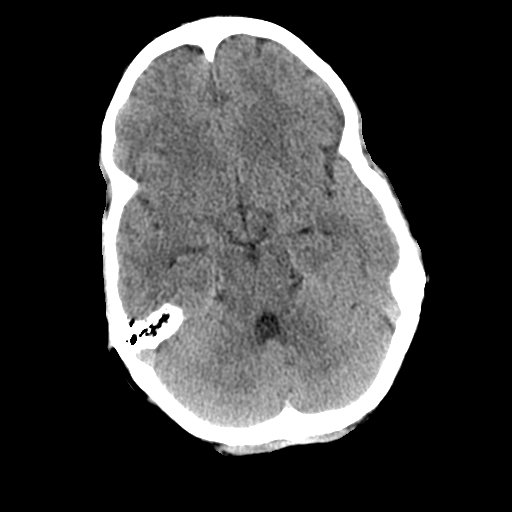
[im 12/32  brain]
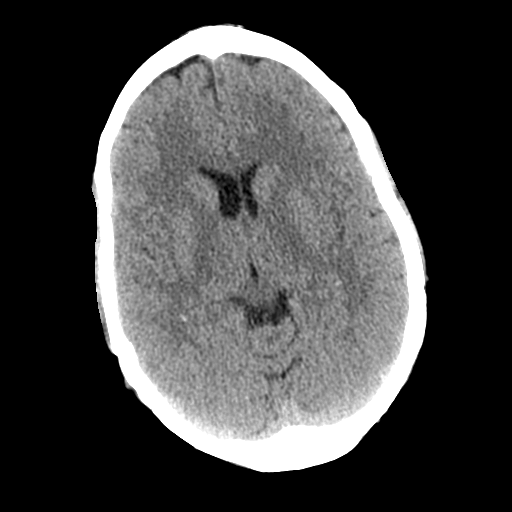
[im 16/32  brain]
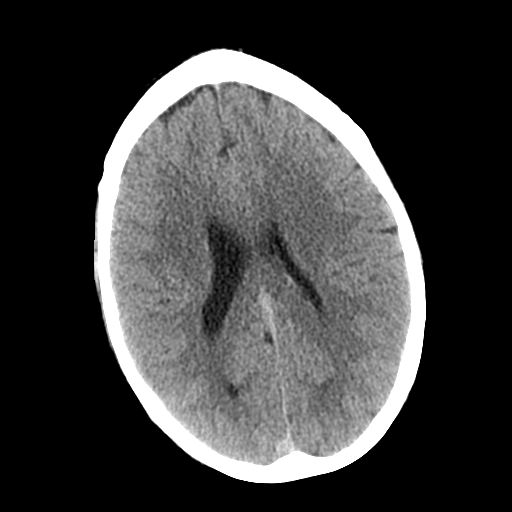
[im 20/32  brain]
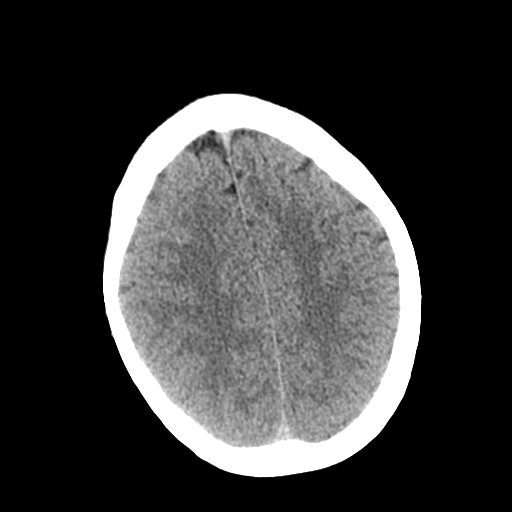
[im 20/32  bone]
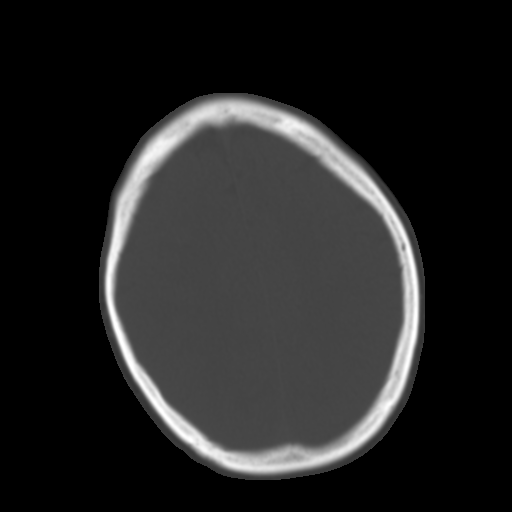
[im 24/32  brain]
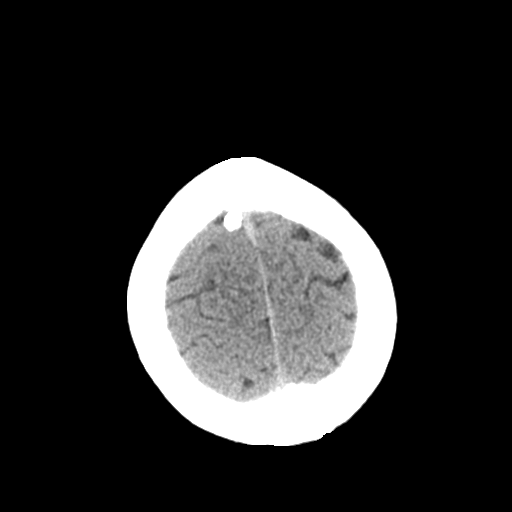
[im 28/32  brain]
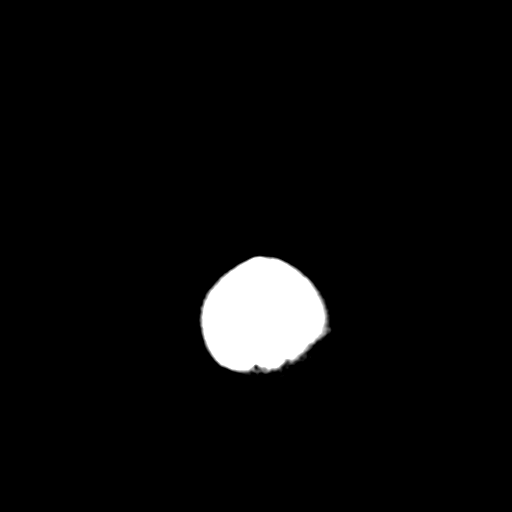

[Series 3: head bone · axial · 0.42mm/px · z∈[+59,+91]mm · 3 of 80 slices shown]
[im 8/80  bone]
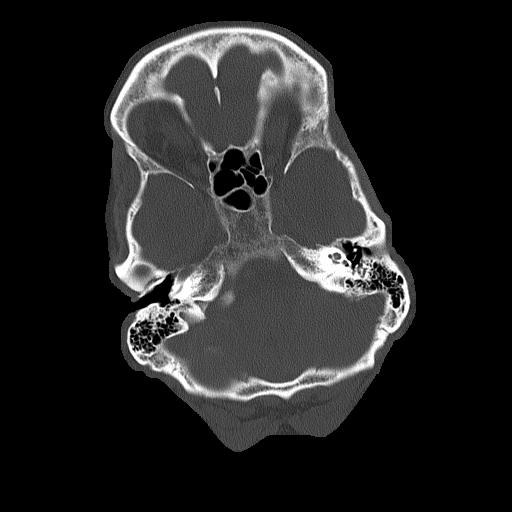
[im 16/80  bone]
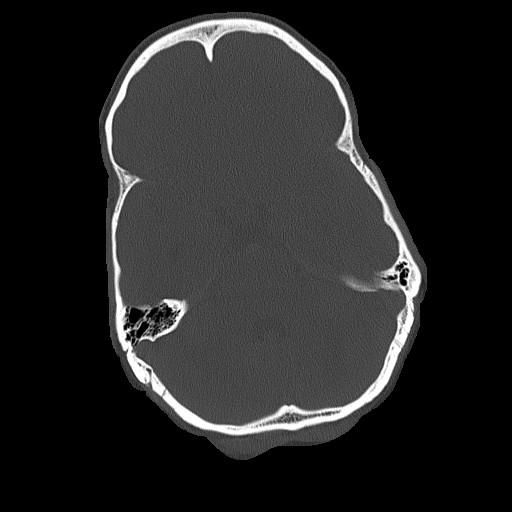
[im 24/80  bone]
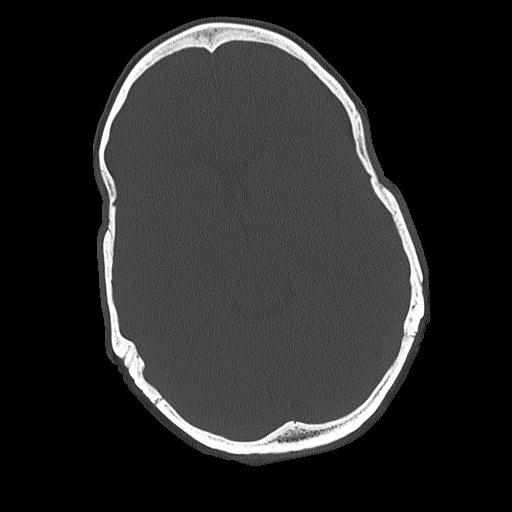

[Series 4: coronal soft · coronal · 0.30mm/px · 3 of 75 slices shown]
[im 25/75  brain]
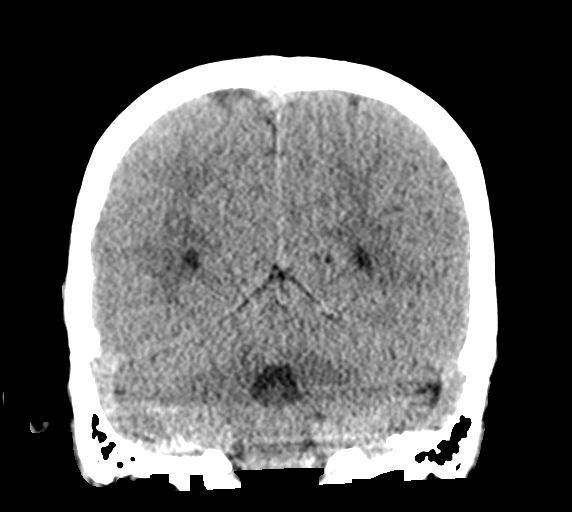
[im 33/75  brain]
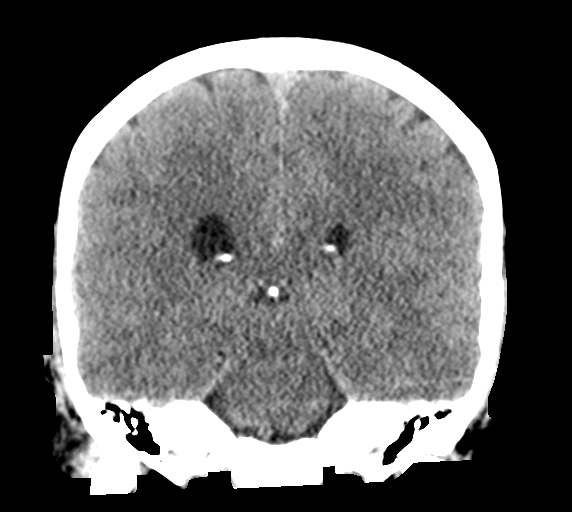
[im 42/75  brain]
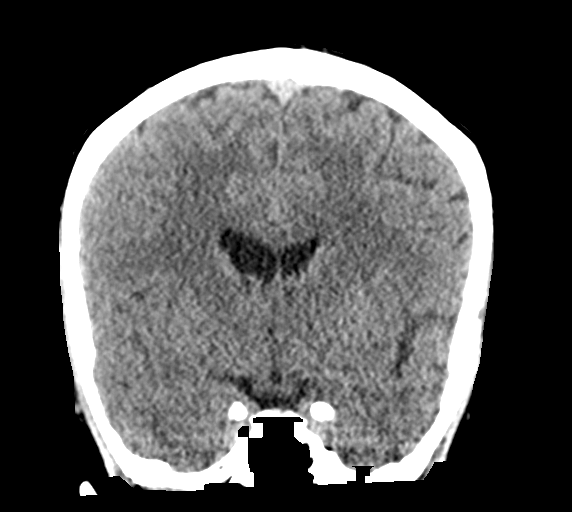

[Series 5: sagittal soft · sagittal · 0.31mm/px · 3 of 59 slices shown]
[im 20/59  brain]
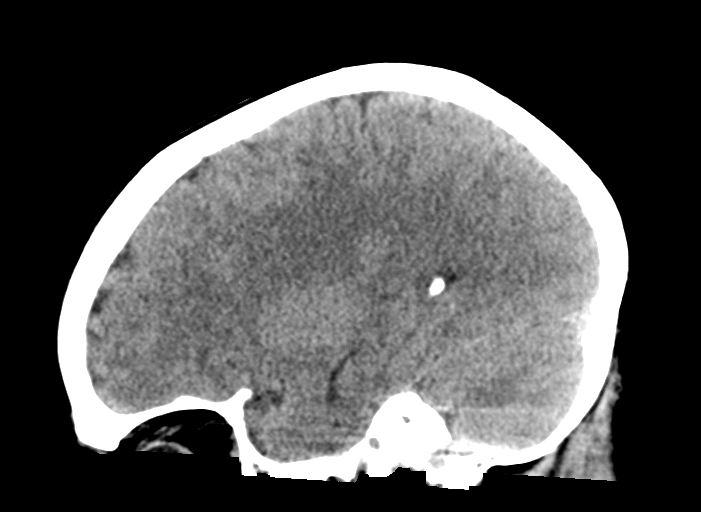
[im 30/59  brain]
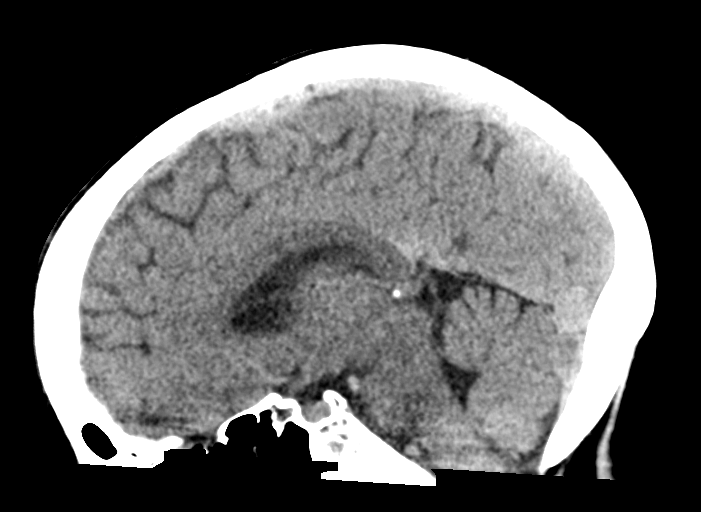
[im 39/59  brain]
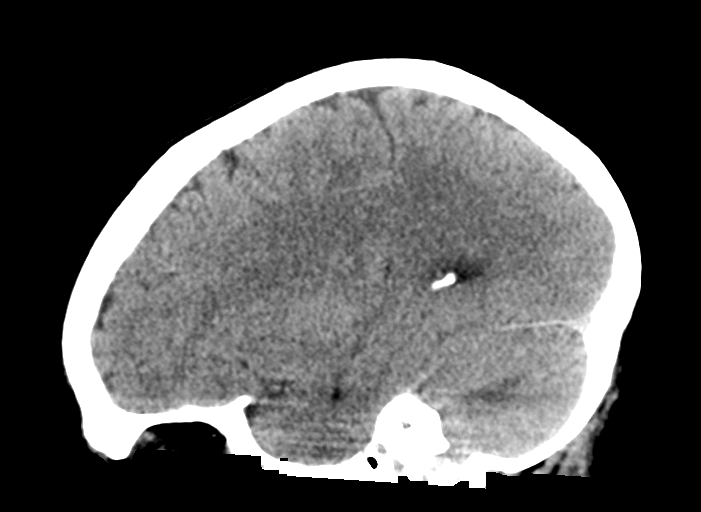

[16 of 47 positions shown; findings below may reference images not displayed]

FINDINGS: Brain: No acute intracranial findings are seen. Ventricles are not
dilated. There are no signs of bleeding within the cranium. There is
no focal mass effect.

Vascular: Unremarkable.

Skull: Unremarkable.

Sinuses/Orbits: There is mucosal thickening in the ethmoid and
sphenoid sinuses.

Other: None
IMPRESSION: No acute intracranial findings are seen in noncontrast CT brain.

## 2023-04-02 IMAGING — CT CT CERVICAL SPINE W/O CM
3 of 4 series · 13 of 33 positions shown, 16 images · non-contrast
Comparison: None.

CLINICAL DATA: Neck pain, dizziness



[Series 5: sagittal bone · sagittal · 0.37mm/px · 5 of 61 slices shown, 6 images]
[im 21/61  bone]
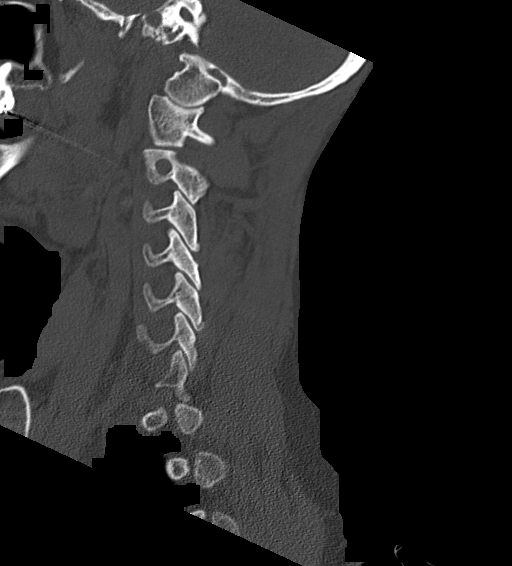
[im 26/61  bone]
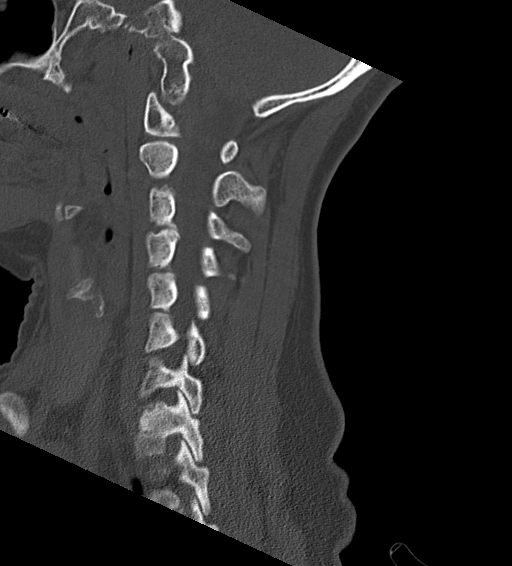
[im 31/61  soft-tissue]
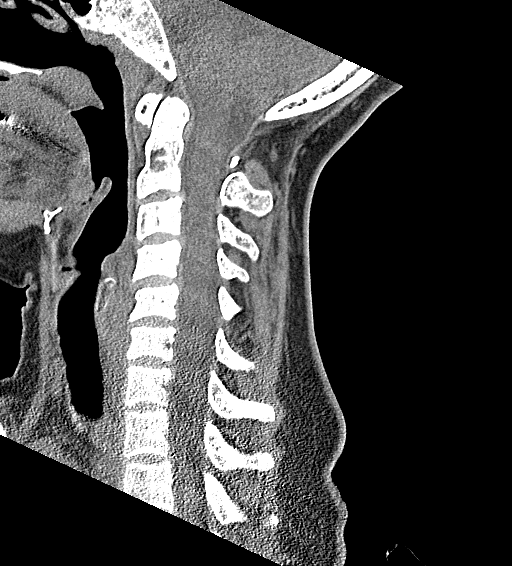
[im 31/61  bone]
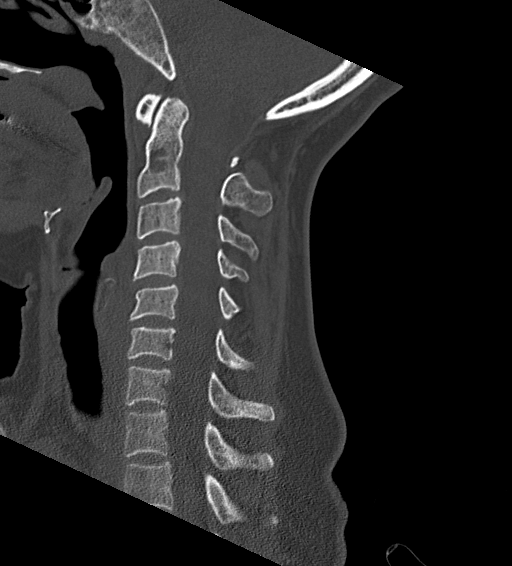
[im 36/61  bone]
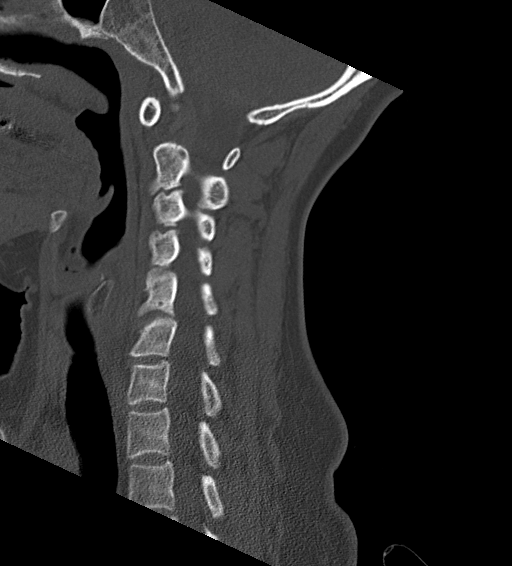
[im 41/61  bone]
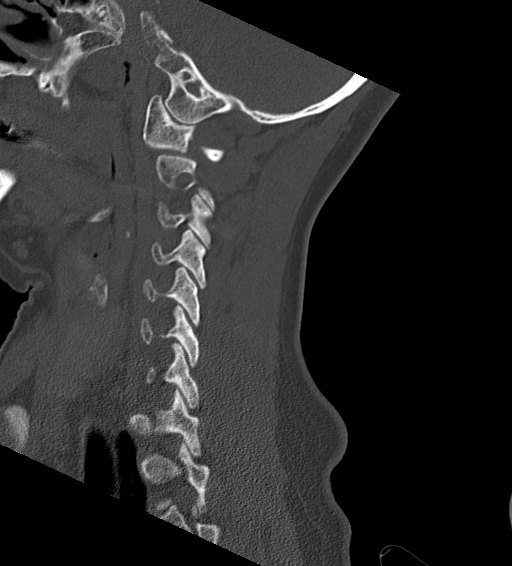

[Series 6: coronal bone · coronal · 0.26mm/px · 3 of 85 slices shown]
[im 22/85  bone]
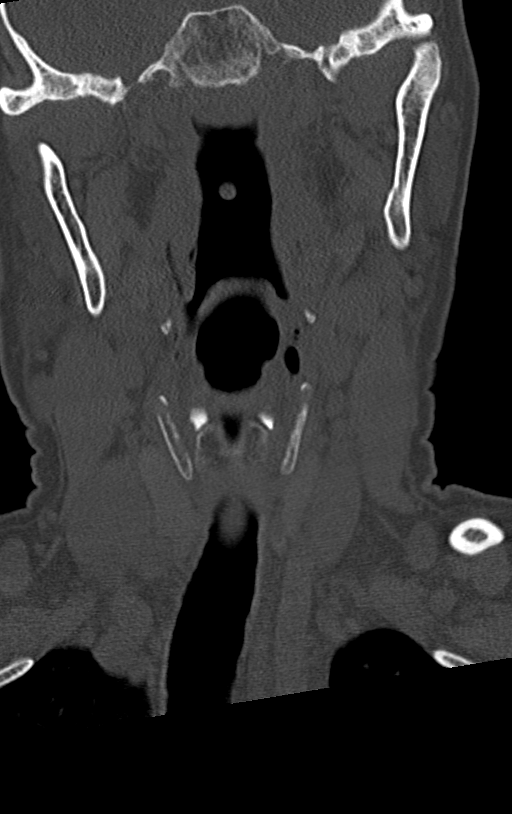
[im 36/85  bone]
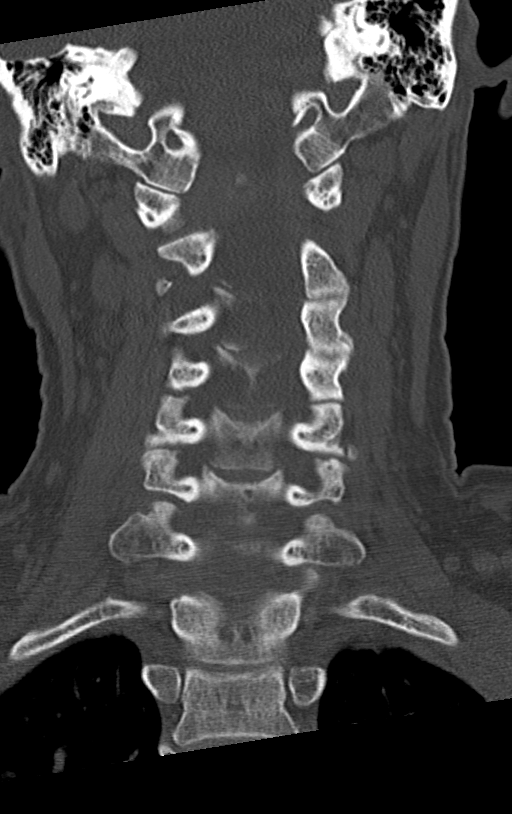
[im 50/85  bone]
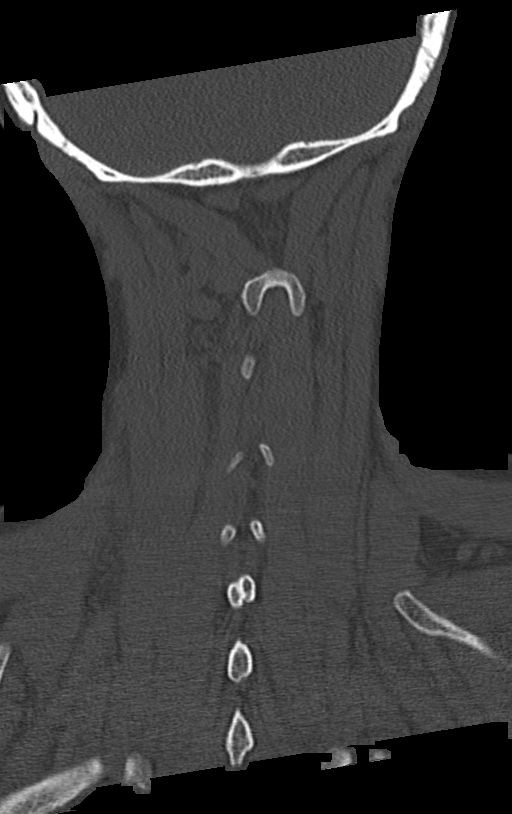

[Series 7: orthogonal axials · axial · 0.21mm/px · z∈[-110,-13]mm · 5 of 87 slices shown, 7 images]
[im 15/87  soft-tissue]
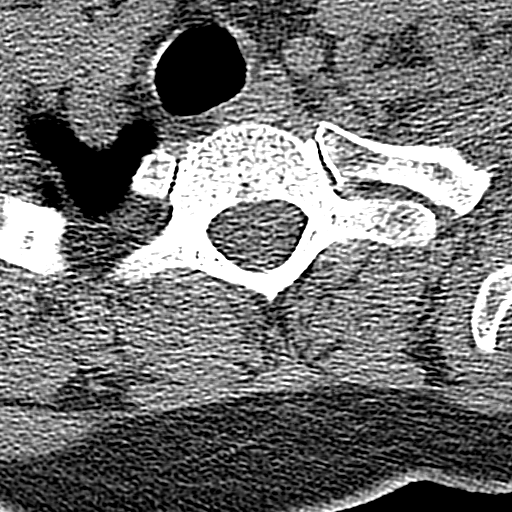
[im 15/87  bone]
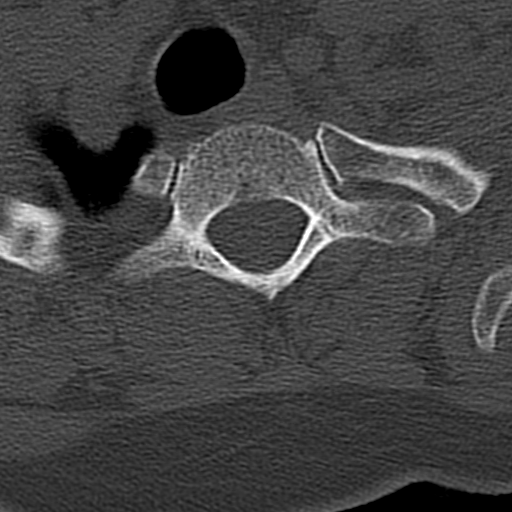
[im 29/87  bone]
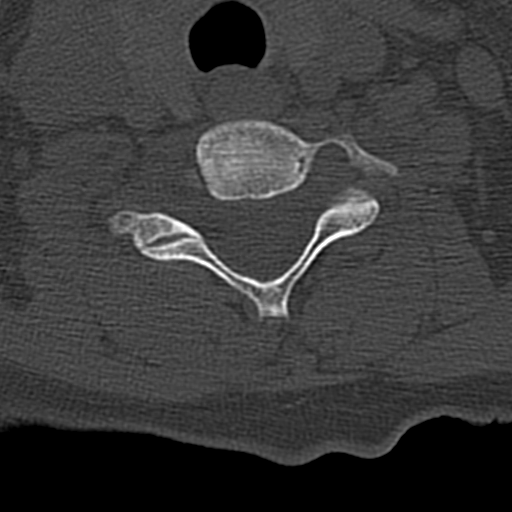
[im 44/87  bone]
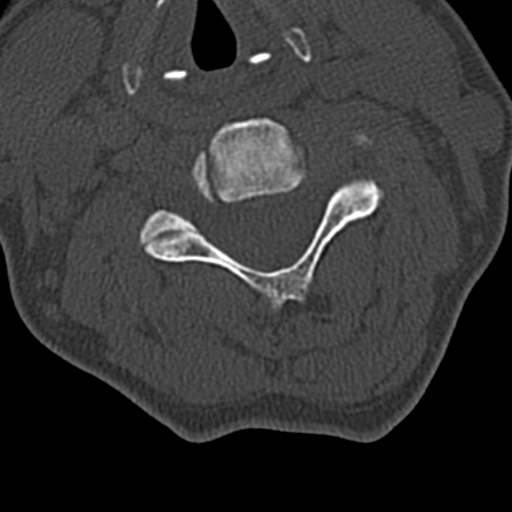
[im 58/87  bone]
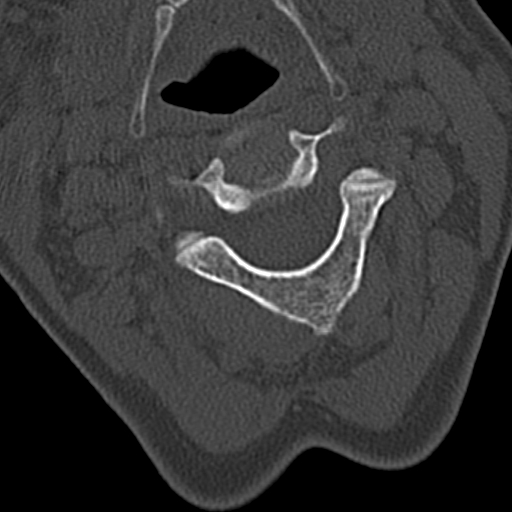
[im 72/87  soft-tissue]
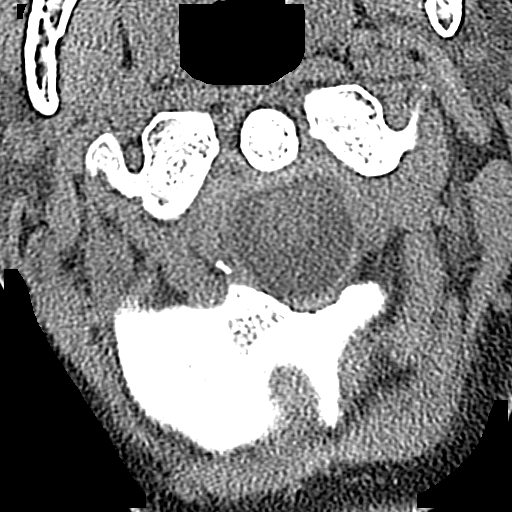
[im 72/87  bone]
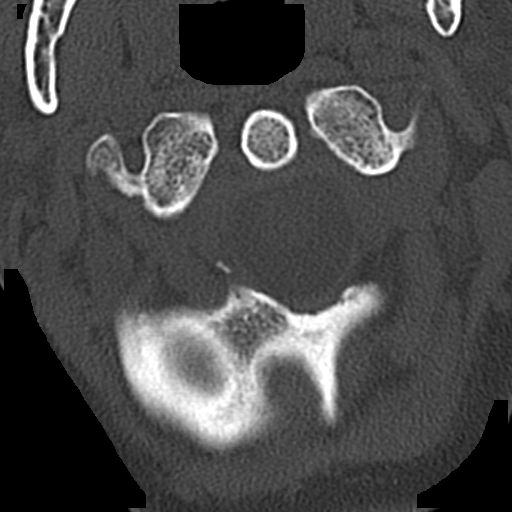

[13 of 33 positions shown; findings below may reference images not displayed]

FINDINGS: Alignment: Alignment of posterior margins of vertebral bodies is
unremarkable.

Skull base and vertebrae: No recent fracture is seen. There is
mm low-density in the body C2 vertebra without break in the cortical
margins. This most likely is a benign process.

Soft tissues and spinal canal: There is no evidence of central
spinal stenosis.

Disc levels: There is minimal encroachment of neural foramina at
C2-C3 and C3-C4 levels.

Upper chest: Unremarkable.

Other: There is slightly inhomogeneous attenuation in the thyroid
with lobulated margins.
IMPRESSION: No recent fracture is seen in the cervical spine. There is no
central spinal stenosis. There is minimal encroachment of neural
foramina by bony spurs at C2-C3 and C3-C4 levels.

## 2023-05-06 DIAGNOSIS — G43E09 Chronic migraine with aura, not intractable, without status migrainosus: Secondary | ICD-10-CM | POA: Diagnosis not present

## 2023-05-06 DIAGNOSIS — M792 Neuralgia and neuritis, unspecified: Secondary | ICD-10-CM | POA: Diagnosis not present

## 2023-09-16 ENCOUNTER — Ambulatory Visit: Payer: 59 | Admitting: Obstetrics & Gynecology

## 2023-10-06 ENCOUNTER — Other Ambulatory Visit (HOSPITAL_COMMUNITY): Payer: Self-pay | Admitting: Family Medicine

## 2023-10-06 DIAGNOSIS — M792 Neuralgia and neuritis, unspecified: Secondary | ICD-10-CM | POA: Diagnosis not present

## 2023-10-06 DIAGNOSIS — R1011 Right upper quadrant pain: Secondary | ICD-10-CM

## 2023-10-06 DIAGNOSIS — G43E09 Chronic migraine with aura, not intractable, without status migrainosus: Secondary | ICD-10-CM | POA: Diagnosis not present

## 2023-10-08 ENCOUNTER — Ambulatory Visit (HOSPITAL_COMMUNITY)
Admission: RE | Admit: 2023-10-08 | Discharge: 2023-10-08 | Disposition: A | Discharge status: Home / Self Care | Payer: 59 | Source: Ambulatory Visit | Attending: Family Medicine | Admitting: Family Medicine

## 2023-10-08 DIAGNOSIS — R1011 Right upper quadrant pain: Secondary | ICD-10-CM | POA: Insufficient documentation

## 2023-10-09 ENCOUNTER — Encounter (HOSPITAL_COMMUNITY): Payer: Self-pay

## 2023-10-09 ENCOUNTER — Emergency Department (HOSPITAL_COMMUNITY)
Admission: EM | Admit: 2023-10-09 | Discharge: 2023-10-09 | Discharge status: Against Medical Advice | Payer: 59 | Source: Home / Self Care

## 2023-10-09 ENCOUNTER — Other Ambulatory Visit: Payer: Self-pay

## 2023-10-09 ENCOUNTER — Emergency Department (HOSPITAL_COMMUNITY): Payer: 59

## 2023-10-09 ENCOUNTER — Emergency Department (HOSPITAL_COMMUNITY)
Admission: EM | Admit: 2023-10-09 | Discharge: 2023-10-09 | Discharge status: Against Medical Advice | Payer: 59 | Attending: Emergency Medicine | Admitting: Emergency Medicine

## 2023-10-09 ENCOUNTER — Encounter (HOSPITAL_COMMUNITY): Payer: Self-pay | Admitting: *Deleted

## 2023-10-09 DIAGNOSIS — R079 Chest pain, unspecified: Secondary | ICD-10-CM | POA: Diagnosis not present

## 2023-10-09 DIAGNOSIS — R0789 Other chest pain: Secondary | ICD-10-CM | POA: Diagnosis not present

## 2023-10-09 DIAGNOSIS — Z5321 Procedure and treatment not carried out due to patient leaving prior to being seen by health care provider: Secondary | ICD-10-CM | POA: Insufficient documentation

## 2023-10-09 DIAGNOSIS — R6889 Other general symptoms and signs: Secondary | ICD-10-CM | POA: Diagnosis not present

## 2023-10-09 DIAGNOSIS — D72829 Elevated white blood cell count, unspecified: Secondary | ICD-10-CM | POA: Diagnosis not present

## 2023-10-09 DIAGNOSIS — R109 Unspecified abdominal pain: Secondary | ICD-10-CM | POA: Insufficient documentation

## 2023-10-09 DIAGNOSIS — Z743 Need for continuous supervision: Secondary | ICD-10-CM | POA: Diagnosis not present

## 2023-10-09 DIAGNOSIS — I499 Cardiac arrhythmia, unspecified: Secondary | ICD-10-CM | POA: Diagnosis not present

## 2023-10-09 DIAGNOSIS — J9811 Atelectasis: Secondary | ICD-10-CM | POA: Diagnosis not present

## 2023-10-09 LAB — CBC WITH DIFFERENTIAL/PLATELET
Abs Immature Granulocytes: 0.06 10*3/uL (ref 0.00–0.07)
Basophils Absolute: 0 10*3/uL (ref 0.0–0.1)
Basophils Relative: 0 %
Eosinophils Absolute: 0.2 10*3/uL (ref 0.0–0.5)
Eosinophils Relative: 1 %
HCT: 40.2 % (ref 36.0–46.0)
Hemoglobin: 13.4 g/dL (ref 12.0–15.0)
Immature Granulocytes: 0 %
Lymphocytes Relative: 14 %
Lymphs Abs: 1.9 10*3/uL (ref 0.7–4.0)
MCH: 30.9 pg (ref 26.0–34.0)
MCHC: 33.3 g/dL (ref 30.0–36.0)
MCV: 92.6 fL (ref 80.0–100.0)
Monocytes Absolute: 1.4 10*3/uL — ABNORMAL HIGH (ref 0.1–1.0)
Monocytes Relative: 10 %
Neutro Abs: 10.2 10*3/uL — ABNORMAL HIGH (ref 1.7–7.7)
Neutrophils Relative %: 75 %
Platelets: 328 10*3/uL (ref 150–400)
RBC: 4.34 MIL/uL (ref 3.87–5.11)
RDW: 13.7 % (ref 11.5–15.5)
WBC: 13.7 10*3/uL — ABNORMAL HIGH (ref 4.0–10.5)
nRBC: 0 % (ref 0.0–0.2)

## 2023-10-09 LAB — COMPREHENSIVE METABOLIC PANEL
ALT: 16 U/L (ref 0–44)
AST: 19 U/L (ref 15–41)
Albumin: 4 g/dL (ref 3.5–5.0)
Alkaline Phosphatase: 65 U/L (ref 38–126)
Anion gap: 13 (ref 5–15)
BUN: 30 mg/dL — ABNORMAL HIGH (ref 6–20)
CO2: 24 mmol/L (ref 22–32)
Calcium: 9.5 mg/dL (ref 8.9–10.3)
Chloride: 101 mmol/L (ref 98–111)
Creatinine, Ser: 0.69 mg/dL (ref 0.44–1.00)
GFR, Estimated: >60 mL/min (ref >60)
Glucose, Bld: 95 mg/dL (ref 70–99)
Potassium: 3.7 mmol/L (ref 3.5–5.1)
Sodium: 138 mmol/L (ref 135–145)
Total Bilirubin: 0.8 mg/dL (ref <1.2)
Total Protein: 7.7 g/dL (ref 6.5–8.1)

## 2023-10-09 LAB — TROPONIN I (HIGH SENSITIVITY)
Troponin I (High Sensitivity): 3 ng/L (ref <18)
Troponin I (High Sensitivity): 3 ng/L (ref <18)

## 2023-10-09 LAB — D-DIMER, QUANTITATIVE: D-Dimer, Quant: 2.43 ug{FEU}/mL — ABNORMAL HIGH (ref 0.00–0.50)

## 2023-10-09 LAB — HCG, QUANTITATIVE, PREGNANCY: hCG, Beta Chain, Quant, S: 1 m[IU]/mL (ref <5)

## 2023-10-09 LAB — LIPASE, BLOOD: Lipase: 25 U/L (ref 11–51)

## 2023-10-09 MED ORDER — IOHEXOL 350 MG/ML SOLN
75.0000 mL | Freq: Once | INTRAVENOUS | Status: AC | PRN
  Administered 2023-10-09: 75 mL via INTRAVENOUS

## 2023-10-09 MED ORDER — HYDROMORPHONE HCL 1 MG/ML IJ SOLN
1.0000 mg | Freq: Once | INTRAMUSCULAR | Status: AC
Start: 2023-10-09 — End: 2023-10-09
  Administered 2023-10-09: 1 mg via INTRAVENOUS
  Filled 2023-10-09: qty 1

## 2023-10-09 MED ORDER — LORAZEPAM 2 MG/ML IJ SOLN
0.5000 mg | Freq: Once | INTRAMUSCULAR | Status: AC
Start: 2023-10-09 — End: 2023-10-09
  Administered 2023-10-09: 0.5 mg via INTRAVENOUS
  Filled 2023-10-09: qty 1

## 2023-10-09 MED ORDER — SODIUM CHLORIDE 0.9 % IV BOLUS
500.0000 mL | Freq: Once | INTRAVENOUS | Status: AC
  Administered 2023-10-09: 500 mL via INTRAVENOUS

## 2023-10-09 MED ORDER — HYDROMORPHONE HCL 1 MG/ML IJ SOLN
0.5000 mg | Freq: Once | INTRAMUSCULAR | Status: AC
  Administered 2023-10-09: 0.5 mg via INTRAVENOUS
  Filled 2023-10-09: qty 0.5

## 2023-10-09 MED ORDER — PANTOPRAZOLE SODIUM 40 MG IV SOLR
40.0000 mg | Freq: Once | INTRAVENOUS | Status: AC
  Administered 2023-10-09: 40 mg via INTRAVENOUS
  Filled 2023-10-09: qty 10

## 2023-10-09 NOTE — ED Triage Notes (Signed)
Pt brought in by [facility] from home with c/o lower chest and abdominal pain x 1 month that is worsening. Robaxin and Excedrin taken at 0300 this morning with no change in pain. EMS reports HR 104, BP 138/87, O2 sat 100% on RA.

## 2023-10-09 NOTE — ED Provider Notes (Signed)
[facility] AT [facility] Provider Note   CSN: [id number] Arrival date & time: 10/09/23  4098     History  Chief Complaint  Patient presents with   Chest Pain    Nancy Burnett is a 52 y.o. female.  Patient with severe right-sided chest pain.  Patient has history of attention deficit disorder  The history is provided by the patient and medical records. No language interpreter was used.  Chest Pain Pain location:  R chest Pain quality: aching   Pain radiates to:  Does not radiate Pain severity:  Severe Onset quality:  Sudden Timing:  Constant Progression:  Worsening Chronicity:  New Context: not breathing   Relieved by:  Nothing Worsened by:  Nothing Ineffective treatments:  None tried Associated symptoms: no abdominal pain, no back pain, no cough, no fatigue and no headache        Home Medications Prior to Admission medications   Medication Sig Start Date End Date Taking? Authorizing Provider  amphetamine-dextroamphetamine (ADDERALL) 30 MG tablet Take 30 mg by mouth 2 (two) times daily. 05/16/23  Yes [provider]  diazepam (VALIUM) 10 MG tablet Take 10 mg by mouth 3 (three) times daily as needed. 09/10/23  Yes [provider]  ibuprofen (ADVIL,MOTRIN) 600 MG tablet Take 1 tablet (600 mg total) by mouth every 6 (six) hours as needed. 03/11/18  Yes Remus Loffler, PA-C  ondansetron (ZOFRAN) 8 MG tablet Take 8 mg by mouth every 8 (eight) hours as needed for nausea. 10/08/23  Yes [provider]  pregabalin (LYRICA) 200 MG capsule Take 1 capsule (200 mg total) by mouth 3 (three) times daily. 02/16/19  Yes Remus Loffler, PA-C  rizatriptan (MAXALT) 10 MG tablet Take 10 mg by mouth as needed for migraine. 10/06/23  Yes [provider]  SUMAtriptan (IMITREX) 100 MG tablet Take 1 tablet (100 mg total) by mouth every 2 (two) hours as needed for migraine. 02/19/19  Yes Remus Loffler, PA-C  venlafaxine XR  (EFFEXOR-XR) 150 MG 24 hr capsule Take 1 capsule (150 mg total) by mouth daily with breakfast. 08/24/18  Yes Remus Loffler, PA-C  phentermine 37.5 MG capsule Take 1 capsule (37.5 mg total) by mouth every morning. 11/05/13 03/17/19  Monica Becton, MD  propranolol ER (INDERAL LA) 160 MG SR capsule Take 1 capsule (160 mg total) by mouth 2 (two) times daily. 11/05/13 03/17/19  Monica Becton, MD  topiramate (TOPAMAX) 50 MG tablet One half tab by mouth daily for a week, then one tab by mouth daily. 11/05/13 03/17/19  Monica Becton, MD      Allergies    Patient has no known allergies.    Review of Systems   Review of Systems  Constitutional:  Negative for appetite change and fatigue.  HENT:  Negative for congestion, ear discharge and sinus pressure.   Eyes:  Negative for discharge.  Respiratory:  Negative for cough.   Cardiovascular:  Positive for chest pain.  Gastrointestinal:  Negative for abdominal pain and diarrhea.  Genitourinary:  Negative for frequency and hematuria.  Musculoskeletal:  Negative for back pain.  Skin:  Negative for rash.  Neurological:  Negative for seizures and headaches.  Psychiatric/Behavioral:  Negative for hallucinations.     Physical Exam Updated Vital Signs BP 116/69   Pulse 89   Temp 98.5 F (36.9 C) (Oral)   Resp 19   Ht 5\' 7"  (1.702 m)   Wt 63.5 kg  LMP 11/24/2020   SpO2 96%   BMI 21.93 kg/m  Physical Exam Vitals and nursing note reviewed.  Constitutional:      Appearance: She is well-developed. She is ill-appearing.  HENT:     Head: Normocephalic.     Nose: Nose normal.  Eyes:     General: No scleral icterus.    Conjunctiva/sclera: Conjunctivae normal.  Neck:     Thyroid: No thyromegaly.  Cardiovascular:     Rate and Rhythm: Normal rate and regular rhythm.     Heart sounds: No murmur heard.    No friction rub. No gallop.  Pulmonary:     Breath sounds: No stridor. No wheezing or rales.  Chest:     Chest wall: No  tenderness.  Abdominal:     General: There is no distension.     Tenderness: There is no abdominal tenderness. There is no rebound.  Musculoskeletal:        General: Normal range of motion.     Cervical back: Neck supple.  Lymphadenopathy:     Cervical: No cervical adenopathy.  Skin:    Findings: No erythema or rash.  Neurological:     Mental Status: She is alert and oriented to person, place, and time.     Motor: No abnormal muscle tone.     Coordination: Coordination normal.  Psychiatric:        Behavior: Behavior normal.     ED Results / Procedures / Treatments   Labs (all labs ordered are listed, but only abnormal results are displayed) Labs Reviewed  CBC WITH DIFFERENTIAL/PLATELET - Abnormal; Notable for the following components:      Result Value   WBC 13.7 (*)    Neutro Abs 10.2 (*)    Monocytes Absolute 1.4 (*)    All other components within normal limits  COMPREHENSIVE METABOLIC PANEL - Abnormal; Notable for the following components:   BUN 30 (*)    All other components within normal limits  D-DIMER, QUANTITATIVE - Abnormal; Notable for the following components:   D-Dimer, Quant 2.43 (*)    All other components within normal limits  LIPASE, BLOOD  HCG, QUANTITATIVE, PREGNANCY  TROPONIN I (HIGH SENSITIVITY)  TROPONIN I (HIGH SENSITIVITY)    EKG None  Radiology DG Chest Port 1 View Result Date: 10/09/2023 CLINICAL DATA:  Lower chest and abdominal pain for 1 month EXAM: PORTABLE CHEST 1 VIEW COMPARISON:  01/23/2022 FINDINGS: Single frontal view of the chest demonstrates an unremarkable cardiac silhouette. No airspace disease, effusion, or pneumothorax. No acute bony abnormalities. IMPRESSION: 1. No acute intrathoracic process. Electronically Signed   By: Sharlet Salina M.D.   On: 10/09/2023 11:13   US Abdomen Limited RUQ (LIVER/GB) Result Date: 10/08/2023 CLINICAL DATA:  Right upper quadrant pain EXAM: ULTRASOUND ABDOMEN LIMITED RIGHT UPPER QUADRANT  COMPARISON:  None Available. FINDINGS: Gallbladder: No gallstones or wall thickening visualized. No sonographic Murphy sign noted by sonographer. Common bile duct: Diameter: 3.2 mm Liver: No focal lesion identified. Within normal limits in parenchymal echogenicity. Portal vein is patent on color Doppler imaging with normal direction of blood flow towards the liver. Other: None. IMPRESSION: No cholelithiasis or sonographic evidence for acute cholecystitis. Electronically Signed   By: Annia Belt M.D.   On: 10/08/2023 08:36    Procedures Procedures    Medications Ordered in ED Medications  HYDROmorphone (DILAUDID) injection 0.5 mg (0.5 mg Intravenous Given 10/09/23 0912)  LORazepam (ATIVAN) injection 0.5 mg (0.5 mg Intravenous Given 10/09/23 0912)  sodium chloride  0.9 % bolus 500 mL (0 mLs Intravenous Stopped 10/09/23 1101)  pantoprazole (PROTONIX) injection 40 mg (40 mg Intravenous Given 10/09/23 0912)  HYDROmorphone (DILAUDID) injection 1 mg (1 mg Intravenous Given 10/09/23 1031)  LORazepam (ATIVAN) injection 0.5 mg (0.5 mg Intravenous Given 10/09/23 1031)  HYDROmorphone (DILAUDID) injection 1 mg (1 mg Intravenous Given 10/09/23 1220)  iohexol (OMNIPAQUE) 350 MG/ML injection 75 mL (75 mLs Intravenous Contrast Given 10/09/23 1129)    ED Course/ Medical Decision Making/ A&P                                 Medical Decision Making Amount and/or Complexity of Data Reviewed Labs: ordered. Radiology: ordered.  Risk Prescription drug management.  This patient presents to the ED for concern of chest pain, this involves an extensive number of treatment options, and is a complaint that carries with it a high risk of complications and morbidity.  The differential diagnosis includes MI, PE   Co morbidities that complicate the patient evaluation  Attention deficit disorder   Additional history obtained:  Additional history obtained from patient External records from outside source  obtained and reviewed including hospital records   Lab Tests:  I Ordered, and personally interpreted labs.  The pertinent results include: White count 13.7, D-dimer 2.4   Imaging Studies ordered:  I ordered imaging studies including chest x-ray and CT angio I independently visualized and interpreted imaging which showed negative I agree with the radiologist interpretation   Cardiac Monitoring: / EKG:  The patient was maintained on a cardiac monitor.  I personally viewed and interpreted the cardiac monitored which showed an underlying rhythm of: Sinus tach   Consultations Obtained: No consultant  Problem List / ED Course / Critical interventions / Medication management  Chest pain I ordered medication including Dilaudid for pain Reevaluation of the patient after these medicines showed that the patient improved I have reviewed the patients home medicines and have made adjustments as needed   Social Determinants of Health:  None   Test / Admission - Considered:  None  Patient left AMA before the CT angio was read.  Patient with atypical chest pain        Final Clinical Impression(s) / ED Diagnoses Final diagnoses:  None    Rx / DC Orders ED Discharge Orders     None         Bethann Berkshire, MD 10/10/23 1710

## 2023-10-09 NOTE — ED Notes (Signed)
Pt not in lobby.  

## 2023-10-09 NOTE — ED Notes (Signed)
2nd call, pt not in lobby. 

## 2023-10-09 NOTE — ED Notes (Signed)
ED Provider at bedside. 
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
## 2023-10-09 NOTE — ED Notes (Signed)
Pt left via elopement while waiting on results to imaging. Significant other with her, pulled IV out took off leads and BP cuff. Significant other stated she did not want to wait

## 2023-10-11 DIAGNOSIS — M546 Pain in thoracic spine: Secondary | ICD-10-CM | POA: Diagnosis not present

## 2023-10-11 DIAGNOSIS — R9431 Abnormal electrocardiogram [ECG] [EKG]: Secondary | ICD-10-CM | POA: Diagnosis not present

## 2023-10-11 DIAGNOSIS — R1013 Epigastric pain: Secondary | ICD-10-CM | POA: Diagnosis not present

## 2023-10-11 DIAGNOSIS — N3000 Acute cystitis without hematuria: Secondary | ICD-10-CM | POA: Diagnosis not present

## 2023-10-11 DIAGNOSIS — K769 Liver disease, unspecified: Secondary | ICD-10-CM | POA: Diagnosis not present

## 2023-10-11 DIAGNOSIS — Z79899 Other long term (current) drug therapy: Secondary | ICD-10-CM | POA: Diagnosis not present

## 2023-10-11 DIAGNOSIS — F17210 Nicotine dependence, cigarettes, uncomplicated: Secondary | ICD-10-CM | POA: Diagnosis not present

## 2023-10-11 DIAGNOSIS — R10816 Epigastric abdominal tenderness: Secondary | ICD-10-CM | POA: Diagnosis not present

## 2023-10-11 DIAGNOSIS — Z881 Allergy status to other antibiotic agents status: Secondary | ICD-10-CM | POA: Diagnosis not present

## 2023-10-20 DIAGNOSIS — Z79899 Other long term (current) drug therapy: Secondary | ICD-10-CM | POA: Diagnosis not present

## 2023-10-20 DIAGNOSIS — N3090 Cystitis, unspecified without hematuria: Secondary | ICD-10-CM | POA: Diagnosis not present

## 2023-10-20 DIAGNOSIS — R1013 Epigastric pain: Secondary | ICD-10-CM | POA: Diagnosis not present

## 2023-10-20 DIAGNOSIS — M792 Neuralgia and neuritis, unspecified: Secondary | ICD-10-CM | POA: Diagnosis not present
